# Patient Record
Sex: Male | Born: 1955 | Race: Black or African American | Hispanic: No | Marital: Married | Smoking: Current every day smoker
Health system: Southern US, Community
[De-identification: ages and names within clinical notes are randomized; demographics above are authoritative.]

## PROBLEM LIST (undated history)

## (undated) DIAGNOSIS — T8859XA Other complications of anesthesia, initial encounter: Secondary | ICD-10-CM

## (undated) DIAGNOSIS — K5790 Diverticulosis of intestine, part unspecified, without perforation or abscess without bleeding: Secondary | ICD-10-CM

## (undated) DIAGNOSIS — I10 Essential (primary) hypertension: Secondary | ICD-10-CM

## (undated) DIAGNOSIS — J387 Other diseases of larynx: Secondary | ICD-10-CM

## (undated) DIAGNOSIS — K219 Gastro-esophageal reflux disease without esophagitis: Secondary | ICD-10-CM

## (undated) DIAGNOSIS — G709 Myoneural disorder, unspecified: Secondary | ICD-10-CM

## (undated) DIAGNOSIS — N529 Male erectile dysfunction, unspecified: Secondary | ICD-10-CM

## (undated) DIAGNOSIS — M1990 Unspecified osteoarthritis, unspecified site: Secondary | ICD-10-CM

## (undated) DIAGNOSIS — R7301 Impaired fasting glucose: Secondary | ICD-10-CM

## (undated) DIAGNOSIS — E785 Hyperlipidemia, unspecified: Secondary | ICD-10-CM

## (undated) DIAGNOSIS — M109 Gout, unspecified: Secondary | ICD-10-CM

## (undated) DIAGNOSIS — G4730 Sleep apnea, unspecified: Secondary | ICD-10-CM

## (undated) HISTORY — PX: TONSILLECTOMY: SUR1361

## (undated) HISTORY — DX: Unspecified osteoarthritis, unspecified site: M19.90

## (undated) HISTORY — DX: Male erectile dysfunction, unspecified: N52.9

## (undated) HISTORY — DX: Impaired fasting glucose: R73.01

## (undated) HISTORY — DX: Hyperlipidemia, unspecified: E78.5

## (undated) HISTORY — PX: JOINT REPLACEMENT: SHX530

## (undated) HISTORY — DX: Gout, unspecified: M10.9

## (undated) HISTORY — DX: Essential (primary) hypertension: I10

## (undated) HISTORY — PX: ACHILLES TENDON SURGERY: SHX542

## (undated) HISTORY — DX: Diverticulosis of intestine, part unspecified, without perforation or abscess without bleeding: K57.90

---

## 2003-02-21 HISTORY — PX: TOTAL HIP ARTHROPLASTY: SHX124

## 2008-07-19 ENCOUNTER — Emergency Department (HOSPITAL_COMMUNITY): Admission: EM | Admit: 2008-07-19 | Discharge: 2008-07-19 | Payer: Self-pay | Admitting: Emergency Medicine

## 2012-06-22 ENCOUNTER — Other Ambulatory Visit: Payer: Self-pay | Admitting: Family Medicine

## 2012-08-29 ENCOUNTER — Ambulatory Visit: Payer: Self-pay | Admitting: Family Medicine

## 2012-09-16 ENCOUNTER — Other Ambulatory Visit: Payer: Self-pay | Admitting: *Deleted

## 2012-09-16 ENCOUNTER — Other Ambulatory Visit: Payer: Self-pay

## 2012-09-16 MED ORDER — EZETIMIBE 10 MG PO TABS: 10.0000 mg | ORAL_TABLET | Freq: Every day | ORAL | Status: DC

## 2012-09-16 MED ORDER — OLMESARTAN-AMLODIPINE-HCTZ 40-5-25 MG PO TABS: 1.0000 | ORAL_TABLET | Freq: Every day | ORAL | Status: DC

## 2012-10-11 ENCOUNTER — Other Ambulatory Visit: Payer: Self-pay | Admitting: *Deleted

## 2012-10-11 ENCOUNTER — Other Ambulatory Visit: Payer: Self-pay

## 2012-10-11 DIAGNOSIS — Z125 Encounter for screening for malignant neoplasm of prostate: Secondary | ICD-10-CM

## 2012-10-11 DIAGNOSIS — Z0000 Encounter for general adult medical examination without abnormal findings: Secondary | ICD-10-CM

## 2012-10-11 LAB — COMPLETE METABOLIC PANEL WITH GFR
ALT: 22 U/L (ref 0–53)
AST: 22 U/L (ref 0–37)
Albumin: 4.5 g/dL (ref 3.5–5.2)
Alkaline Phosphatase: 49 U/L (ref 39–117)
BUN: 17 mg/dL (ref 6–23)
CO2: 25 mEq/L (ref 19–32)
Calcium: 10 mg/dL (ref 8.4–10.5)
Chloride: 105 mEq/L (ref 96–112)
Creat: 1.24 mg/dL (ref 0.50–1.35)
GFR, Est African American: 75 mL/min
GFR, Est Non African American: 65 mL/min
Glucose, Bld: 98 mg/dL (ref 70–99)
Potassium: 4 mEq/L (ref 3.5–5.3)
Sodium: 141 mEq/L (ref 135–145)
Total Bilirubin: 0.8 mg/dL (ref 0.3–1.2)
Total Protein: 7.2 g/dL (ref 6.0–8.3)

## 2012-10-11 LAB — CBC WITH DIFFERENTIAL/PLATELET
Basophils Absolute: 0 10*3/uL (ref 0.0–0.1)
Basophils Relative: 0 % (ref 0–1)
Eosinophils Absolute: 0.2 10*3/uL (ref 0.0–0.7)
Eosinophils Relative: 3 % (ref 0–5)
HCT: 42.4 % (ref 39.0–52.0)
Hemoglobin: 14.7 g/dL (ref 13.0–17.0)
Lymphocytes Relative: 41 % (ref 12–46)
Lymphs Abs: 1.9 10*3/uL (ref 0.7–4.0)
MCH: 28.9 pg (ref 26.0–34.0)
MCHC: 34.7 g/dL (ref 30.0–36.0)
MCV: 83.5 fL (ref 78.0–100.0)
Monocytes Absolute: 0.6 10*3/uL (ref 0.1–1.0)
Monocytes Relative: 14 % — ABNORMAL HIGH (ref 3–12)
Neutro Abs: 1.9 10*3/uL (ref 1.7–7.7)
Neutrophils Relative %: 42 % — ABNORMAL LOW (ref 43–77)
Platelets: 239 10*3/uL (ref 150–400)
RBC: 5.08 MIL/uL (ref 4.22–5.81)
RDW: 14.2 % (ref 11.5–15.5)
WBC: 4.6 10*3/uL (ref 4.0–10.5)

## 2012-10-11 LAB — LIPID PANEL
Cholesterol: 218 mg/dL — ABNORMAL HIGH (ref 0–200)
HDL: 31 mg/dL — ABNORMAL LOW (ref >39)
LDL Cholesterol: 129 mg/dL — ABNORMAL HIGH (ref 0–99)
Total CHOL/HDL Ratio: 7 Ratio
Triglycerides: 289 mg/dL — ABNORMAL HIGH (ref <150)
VLDL: 58 mg/dL — ABNORMAL HIGH (ref 0–40)

## 2012-10-11 LAB — PSA, TOTAL AND FREE
PSA, Free Pct: 26 % (ref >25)
PSA, Free: 0.29 ng/mL
PSA: 1.12 ng/mL (ref <=4.00)

## 2012-10-18 ENCOUNTER — Encounter: Payer: Self-pay | Admitting: Family Medicine

## 2012-10-18 ENCOUNTER — Ambulatory Visit (INDEPENDENT_AMBULATORY_CARE_PROVIDER_SITE_OTHER): Admitting: Family Medicine

## 2012-10-18 VITALS — BP 149/84 | HR 71 | Resp 16 | Ht 75.0 in | Wt 257.0 lb

## 2012-10-18 DIAGNOSIS — I10 Essential (primary) hypertension: Secondary | ICD-10-CM

## 2012-10-18 DIAGNOSIS — IMO0001 Reserved for inherently not codable concepts without codable children: Secondary | ICD-10-CM | POA: Insufficient documentation

## 2012-10-18 DIAGNOSIS — M109 Gout, unspecified: Secondary | ICD-10-CM | POA: Insufficient documentation

## 2012-10-18 DIAGNOSIS — N529 Male erectile dysfunction, unspecified: Secondary | ICD-10-CM

## 2012-10-18 DIAGNOSIS — E785 Hyperlipidemia, unspecified: Secondary | ICD-10-CM

## 2012-10-18 DIAGNOSIS — M2550 Pain in unspecified joint: Secondary | ICD-10-CM

## 2012-10-18 DIAGNOSIS — M1990 Unspecified osteoarthritis, unspecified site: Secondary | ICD-10-CM

## 2012-10-18 LAB — SEDIMENTATION RATE: Sed Rate: 7 mm/hr (ref 0–16)

## 2012-10-18 LAB — URIC ACID: Uric Acid, Serum: 8.5 mg/dL — ABNORMAL HIGH (ref 4.0–7.8)

## 2012-10-18 LAB — CK: Total CK: 420 U/L — ABNORMAL HIGH (ref 7–232)

## 2012-10-18 LAB — C-REACTIVE PROTEIN: CRP: <0.5 mg/dL (ref <0.60)

## 2012-10-18 LAB — RHEUMATOID FACTOR: Rhuematoid fact SerPl-aCnc: 11 IU/mL (ref <=14)

## 2012-10-18 MED ORDER — CHOLINE FENOFIBRATE 135 MG PO CPDR: 135.0000 mg | DELAYED_RELEASE_CAPSULE | Freq: Every day | ORAL | Status: DC

## 2012-10-18 MED ORDER — EZETIMIBE 10 MG PO TABS: 10.0000 mg | ORAL_TABLET | Freq: Every day | ORAL | Status: DC

## 2012-10-18 MED ORDER — VARDENAFIL HCL 20 MG PO TABS: 20.0000 mg | ORAL_TABLET | Freq: Every day | ORAL | Status: DC | PRN

## 2012-10-18 MED ORDER — IBUPROFEN 800 MG PO TABS: 800.0000 mg | ORAL_TABLET | Freq: Three times a day (TID) | ORAL | Status: DC | PRN

## 2012-10-18 MED ORDER — OLMESARTAN-AMLODIPINE-HCTZ 40-5-25 MG PO TABS: 1.0000 | ORAL_TABLET | Freq: Every day | ORAL | Status: DC

## 2012-10-18 MED ORDER — OMEGA-3-ACID ETHYL ESTERS 1 G PO CAPS: 2.0000 g | ORAL_CAPSULE | Freq: Two times a day (BID) | ORAL | Status: DC

## 2012-10-18 NOTE — Patient Instructions (Addendum)
1)  Cholesterol - Take Zetia, Lovaza and Triplipix daily.  We'll hold on the statin for now.  2)  BP - Take your Tribenzor daily and decrease your sodium.   2 Gram Low Sodium Diet A 2 gram sodium diet restricts the amount of sodium in the diet to no more than 2 g or 2000 mg daily. Limiting the amount of sodium is often used to help lower blood pressure. It is important if you have heart, liver, or kidney problems. Many foods contain sodium for flavor and sometimes as a preservative. When the amount of sodium in a diet needs to be low, it is important to know what to look for when choosing foods and drinks. The following includes some information and guidelines to help make it easier for you to adapt to a low sodium diet. QUICK TIPS  Do not add salt to food.  Avoid convenience items and fast food.  Choose unsalted snack foods.  Buy lower sodium products, often labeled as "lower sodium" or "no salt added."  Check food labels to learn how much sodium is in 1 serving.  When eating at a restaurant, ask that your food be prepared with less salt or none, if possible. READING FOOD LABELS FOR SODIUM INFORMATION The nutrition facts label is a good place to find how much sodium is in foods. Look for products with no more than 500 to 600 mg of sodium per meal and no more than 150 mg per serving. Remember that 2 g = 2000 mg. The food label may also list foods as:  Sodium-free: Less than 5 mg in a serving.  Very low sodium: 35 mg or less in a serving.  Low-sodium: 140 mg or less in a serving.  Light in sodium: 50% less sodium in a serving. For example, if a food that usually has 300 mg of sodium is changed to become light in sodium, it will have 150 mg of sodium.  Reduced sodium: 25% less sodium in a serving. For example, if a food that usually has 400 mg of sodium is changed to reduced sodium, it will have 300 mg of sodium. CHOOSING FOODS Grains  Avoid: Salted crackers and snack items. Some  cereals, including instant hot cereals. Bread stuffing and biscuit mixes. Seasoned rice or pasta mixes.  Choose: Unsalted snack items. Low-sodium cereals, oats, puffed wheat and rice, shredded wheat. English muffins and bread. Pasta. Meats  Avoid: Salted, canned, smoked, spiced, pickled meats, including fish and poultry. Bacon, ham, sausage, cold cuts, hot dogs, anchovies.  Choose: Low-sodium canned tuna and salmon. Fresh or frozen meat, poultry, and fish. Dairy  Avoid: Processed cheese and spreads. Cottage cheese. Buttermilk and condensed milk. Regular cheese.  Choose: Milk. Low-sodium cottage cheese. Yogurt. Sour cream. Low-sodium cheese. Fruits and Vegetables  Avoid: Regular canned vegetables. Regular canned tomato sauce and paste. Frozen vegetables in sauces. Olives. Rosita Fire. Relishes. Sauerkraut.  Choose: Low-sodium canned vegetables. Low-sodium tomato sauce and paste. Frozen or fresh vegetables. Fresh and frozen fruit. Condiments  Avoid: Canned and packaged gravies. Worcestershire sauce. Tartar sauce. Barbecue sauce. Soy sauce. Steak sauce. Ketchup. Onion, garlic, and table salt. Meat flavorings and tenderizers.  Choose: Fresh and dried herbs and spices. Low-sodium varieties of mustard and ketchup. Lemon juice. Tabasco sauce. Horseradish. SAMPLE 2 GRAM SODIUM MEAL PLAN Breakfast / Sodium (mg)  1 cup low-fat milk / 143 mg  2 slices whole-wheat toast / 270 mg  1 tbs heart-healthy margarine / 153 mg  1 hard-boiled egg /  139 mg  1 small orange / 0 mg Lunch / Sodium (mg)  1 cup raw carrots / 76 mg   cup hummus / 298 mg  1 cup low-fat milk / 143 mg   cup red grapes / 2 mg  1 whole-wheat pita bread / 356 mg Dinner / Sodium (mg)  1 cup whole-wheat pasta / 2 mg  1 cup low-sodium tomato sauce / 73 mg  3 oz lean ground beef / 57 mg  1 small side salad (1 cup raw spinach leaves,  cup cucumber,  cup yellow bell pepper) with 1 tsp olive oil and 1 tsp red wine vinegar  / 25 mg Snack / Sodium (mg)  1 container low-fat vanilla yogurt / 107 mg  3 graham cracker squares / 127 mg Nutrient Analysis  Calories: 2033  Protein: 77 g  Carbohydrate: 282 g  Fat: 72 g  Sodium: 1971 mg Document Released: 02/06/2005 Document Revised: 05/01/2011 Document Reviewed: 05/10/2009 [employer] Patient Information 2014 [place], [place].

## 2012-10-18 NOTE — Progress Notes (Signed)
Subjective:    Patient ID: Terrance Snyder, male    DOB: 16-Nov-1955, 57 y.o.   MRN: [id number]  HPI  [name] is here today to go over his most recent lab results, discuss the conditions listed below and to get refills on his medications:    1)  Hyperlipidemia:  He is supposed to be on a combination of Zetia, Welchol and Lovaza.  He is taking the Zetia daily.  He admits to not be very good at all with taking the [facility].  He would prefer to take pills over the drink.  He is also supposed to be on Lovaza but he thinks that his wife has been taking his supply!!    2)  Hypertension:  He continues doing well withTribenzor (40/5/25) and needs a refill on it.    3)  ED:   He needs a refill on his Levitra.    4)  Generalized Joint Pain:  He continues to struggle with pain in his left hip and shoulders.   He needs a refill on his Ibuprofen 800 mg. He last saw Dr. Thamas Jaegers a year ago.     Review of Systems  Constitutional: Negative.   HENT: Negative.   Eyes: Negative.   Respiratory: Negative.   Cardiovascular: Negative.   Gastrointestinal: Negative.   Endocrine: Negative.   Genitourinary: Negative.   Musculoskeletal: Positive for arthralgias.  Skin: Negative.   Allergic/Immunologic: Negative.   Neurological: Negative.   Hematological: Negative.   Psychiatric/Behavioral: Negative.     Past Medical History  Diagnosis Date  . Hypertension   . Hyperlipidemia   . Diverticulosis   . Osteoarthritis   . Gout   . Impaired fasting glucose   . ED (erectile dysfunction)     Past Surgical History  Procedure Laterality Date  . Joint replacement      THR right  . Achilles tendon surgery      Right    Family History  Problem Relation Age of Onset  . CVA Mother   . Rheum arthritis Mother   . Hyperlipidemia Mother   . Hyperlipidemia Father   . Cancer Father     Colon Cancer    History   Social History Narrative   Marital Status: Married Environmental education officer)    Children:  Daughters (3) Son (1)     Pets:  None    Living Situation: Lives with spouse and children.     Occupation: [employer] ([employer]); He was in the [employer] for 4 years.     Education:  Financial planner)    Tobacco Use/Exposure:  He smokes 1/2 - 1 ppd.    Alcohol Use:  Daily (Beer)    Drug Use:  None   Diet:  Regular   Exercise:  Limited    Hobbies:  Gardening                 Objective:   Physical Exam  Vitals reviewed. Constitutional: He is oriented to person, place, and time. He appears well-developed and well-nourished. No distress.  Eyes: Conjunctivae are normal. No scleral icterus.  Neck: Neck supple. No thyromegaly present.  Cardiovascular: Normal rate, regular rhythm and normal heart sounds.   Pulmonary/Chest: Effort normal and breath sounds normal.  Musculoskeletal: He exhibits no edema and no tenderness.  Neurological: He is alert and oriented to person, place, and time.  Skin: Skin is warm and dry.  Psychiatric: He has a normal mood and affect.      Assessment &  Plan:

## 2012-10-22 LAB — ANA: Anti Nuclear Antibody(ANA): NEGATIVE

## 2012-10-22 LAB — CYCLIC CITRUL PEPTIDE ANTIBODY, IGG: Cyclic Citrullin Peptide Ab: 21.3 U/mL — ABNORMAL HIGH (ref 0.0–5.0)

## 2012-10-25 ENCOUNTER — Telehealth: Payer: Self-pay | Admitting: *Deleted

## 2012-10-25 NOTE — Telephone Encounter (Signed)
Terrance Snyder is aware of his appt with Dr Sharmon Revere on 11/04/12 at 6:00. Pg

## 2012-12-21 NOTE — Assessment & Plan Note (Signed)
Rechecking a Rheumatoid Factor.

## 2012-12-21 NOTE — Assessment & Plan Note (Signed)
He will try a combination of Zetia, Lovaza and Trilipix.  We might consider Livalo in the future depending on his CPK.

## 2012-12-21 NOTE — Assessment & Plan Note (Signed)
Refilled his Levitra.

## 2012-12-21 NOTE — Assessment & Plan Note (Signed)
Refilled his Ibuprofen.

## 2012-12-21 NOTE — Assessment & Plan Note (Signed)
Checking an arthritis panel.

## 2012-12-21 NOTE — Assessment & Plan Note (Signed)
His uric acid was noted to be elevated a couple of years ago (8.9). He was on Uloric due to elevated creatinine.  He is not taking it at this time.

## 2012-12-21 NOTE — Assessment & Plan Note (Addendum)
His BP is too high and he admits to missing his medication at times. He is to work harder on diet and exercise.

## 2012-12-28 ENCOUNTER — Other Ambulatory Visit: Payer: Self-pay | Admitting: Family Medicine

## 2013-01-20 ENCOUNTER — Other Ambulatory Visit

## 2013-01-24 ENCOUNTER — Ambulatory Visit: Admitting: Family Medicine

## 2013-01-29 ENCOUNTER — Other Ambulatory Visit: Payer: Self-pay | Admitting: *Deleted

## 2013-01-29 DIAGNOSIS — E785 Hyperlipidemia, unspecified: Secondary | ICD-10-CM

## 2013-01-29 DIAGNOSIS — M109 Gout, unspecified: Secondary | ICD-10-CM

## 2013-01-30 ENCOUNTER — Other Ambulatory Visit

## 2013-01-30 LAB — COMPLETE METABOLIC PANEL WITH GFR
ALT: 17 U/L (ref 0–53)
AST: 17 U/L (ref 0–37)
Albumin: 4.5 g/dL (ref 3.5–5.2)
Alkaline Phosphatase: 49 U/L (ref 39–117)
BUN: 18 mg/dL (ref 6–23)
CO2: 31 mEq/L (ref 19–32)
Calcium: 10.4 mg/dL (ref 8.4–10.5)
Chloride: 100 mEq/L (ref 96–112)
Creat: 1.19 mg/dL (ref 0.50–1.35)
GFR, Est African American: 78 mL/min
GFR, Est Non African American: 67 mL/min
Glucose, Bld: 101 mg/dL — ABNORMAL HIGH (ref 70–99)
Potassium: 3.8 mEq/L (ref 3.5–5.3)
Sodium: 140 mEq/L (ref 135–145)
Total Bilirubin: 0.6 mg/dL (ref 0.3–1.2)
Total Protein: 7.1 g/dL (ref 6.0–8.3)

## 2013-01-30 LAB — LIPID PANEL
Cholesterol: 222 mg/dL — ABNORMAL HIGH (ref 0–200)
HDL: 36 mg/dL — ABNORMAL LOW (ref >39)
LDL Cholesterol: 149 mg/dL — ABNORMAL HIGH (ref 0–99)
Total CHOL/HDL Ratio: 6.2 Ratio
Triglycerides: 187 mg/dL — ABNORMAL HIGH (ref <150)
VLDL: 37 mg/dL (ref 0–40)

## 2013-01-30 LAB — URIC ACID: Uric Acid, Serum: 8.6 mg/dL — ABNORMAL HIGH (ref 4.0–7.8)

## 2013-02-06 ENCOUNTER — Encounter (INDEPENDENT_AMBULATORY_CARE_PROVIDER_SITE_OTHER): Payer: Self-pay

## 2013-02-06 ENCOUNTER — Ambulatory Visit (INDEPENDENT_AMBULATORY_CARE_PROVIDER_SITE_OTHER): Admitting: Family Medicine

## 2013-02-06 ENCOUNTER — Encounter: Payer: Self-pay | Admitting: Family Medicine

## 2013-02-06 VITALS — BP 153/88 | HR 64 | Resp 16 | Wt 269.0 lb

## 2013-02-06 DIAGNOSIS — E785 Hyperlipidemia, unspecified: Secondary | ICD-10-CM

## 2013-02-06 DIAGNOSIS — K219 Gastro-esophageal reflux disease without esophagitis: Secondary | ICD-10-CM

## 2013-02-06 DIAGNOSIS — M1990 Unspecified osteoarthritis, unspecified site: Secondary | ICD-10-CM

## 2013-02-06 MED ORDER — FENOFIBRATE 120 MG PO TABS: 1.0000 | ORAL_TABLET | Freq: Every day | ORAL | Status: DC

## 2013-02-06 MED ORDER — EZETIMIBE 10 MG PO TABS: 10.0000 mg | ORAL_TABLET | Freq: Every day | ORAL | Status: AC

## 2013-02-06 MED ORDER — OMEPRAZOLE 20 MG PO CPDR: 20.0000 mg | DELAYED_RELEASE_CAPSULE | Freq: Two times a day (BID) | ORAL | Status: DC

## 2013-02-06 MED ORDER — IBUPROFEN 800 MG PO TABS: 800.0000 mg | ORAL_TABLET | Freq: Three times a day (TID) | ORAL | Status: AC | PRN

## 2013-02-06 NOTE — Progress Notes (Signed)
Subjective:    Patient ID: Terrance Snyder, male    DOB: [birth year], 57 y.o.   MRN: [id number]  HPI  Terrance Snyder is here today to go over his most recent lab results and to discuss the conditions listed below:   1)  Left Hip Pain:  He has been having pain in his left hip.  He is scheduled to see Dr Thamas Jaegers on 02/14/13.    2)  Hyperlipidemia:  He is no longer taking Welchol but is taking Lovaza and Zetia without any problem.     3)  GERD:  He has noted some acid reflux and burning in his upper abdomen. This problem is usually worse at night.  He has taken antiacid tablets which have not helped him very much.     Review of Systems  Constitutional: Negative for fatigue and unexpected weight change.  HENT: Negative.   Respiratory: Negative for shortness of breath.   Cardiovascular: Negative for chest pain, palpitations and leg swelling.  Gastrointestinal: Negative.        Acid reflux and burning in his upper abdomen.   Genitourinary: Negative.   Musculoskeletal: Positive for arthralgias. Negative for myalgias.       Left hip   Skin: Negative.   Neurological: Negative.   Psychiatric/Behavioral: Negative.     Past Medical History  Diagnosis Date  . Hypertension   . Hyperlipidemia   . Diverticulosis   . Osteoarthritis   . Gout   . Impaired fasting glucose   . ED (erectile dysfunction)      Past Surgical History  Procedure Laterality Date  . Joint replacement      THR right  . Achilles tendon surgery      Right     History   Social History Narrative   Marital Status: Married Environmental education officer)    Children:  Daughters (3) Son (1)    Pets:  None    Living Situation: Lives with spouse and children.     Occupation: [employer] ([employer]); He was in the [employer] for 4 years.     Education:  Financial planner)    Tobacco Use/Exposure:  He smokes 1/2 - 1 ppd.    Alcohol Use:  Daily (Beer)    Drug Use:  None   Diet:  Regular   Exercise:  Limited    Hobbies:   Gardening               Family History  Problem Relation Age of Onset  . CVA Mother   . Rheum arthritis Mother   . Hyperlipidemia Mother   . Hyperlipidemia Father   . Cancer Father     Colon Cancer     Current Outpatient Prescriptions on File Prior to Visit  Medication Sig Dispense Refill  . Olmesartan-Amlodipine-HCTZ (TRIBENZOR) 40-5-25 MG TABS Take 1 tablet by mouth daily.  90 tablet  3  . omega-3 acid ethyl esters (LOVAZA) 1 G capsule Take 2 capsules (2 g total) by mouth 2 (two) times daily.  360 capsule  1  . vardenafil (LEVITRA) 20 MG tablet Take 1 tablet (20 mg total) by mouth daily as needed for erectile dysfunction.  20 tablet  3   No current facility-administered medications on file prior to visit.     No Known Allergies   Immunization History  Administered Date(s) Administered  . Tdap 07/14/2010      Objective:   Physical Exam  Constitutional: He is oriented to person, place, and time. He  appears well-nourished. No distress.  HENT:  Head: Normocephalic.  Eyes: No scleral icterus.  Neck: Neck supple. No thyromegaly present.  Cardiovascular: Normal rate, regular rhythm and normal heart sounds.   Pulmonary/Chest: Effort normal and breath sounds normal.  Musculoskeletal: Normal range of motion. He exhibits no edema.  Neurological: He is alert and oriented to person, place, and time.  Skin: Skin is warm and dry. No rash noted.  Psychiatric: He has a normal mood and affect. His behavior is normal. Judgment and thought content normal.      Assessment & Plan:    Terrance Snyder was seen today for medication management.  Diagnoses and associated orders for this visit:  GERD (gastroesophageal reflux disease) - omeprazole (PRILOSEC) 20 MG capsule; Take 1 capsule (20 mg total) by mouth 2 (two) times daily before a meal.  Osteoarthritis - ibuprofen (ADVIL,MOTRIN) 800 MG tablet; Take 1 tablet (800 mg total) by mouth every 8 (eight) hours as needed.  Other and  unspecified hyperlipidemia Comments:   - ezetimibe (ZETIA) 10 MG tablet; Take 1 tablet (10 mg total) by mouth daily. - Fenofibrate 120 MG TABS; Take 1 tablet (120 mg total) by mouth daily.   TIME SPENT "FACE TO FACE" WITH PATIENT -  30 MINS

## 2013-02-06 NOTE — Patient Instructions (Signed)
1)  Cholesterol - Take the combination of Zetia and Fenoglide plus 1-2 of the Lovaza daily.  We'll recheck your labs in 6 months.    2)  GERD - Take 1 Dexilant daily for your stomach pain.  You'll replace this with omeprazole 2 times per day.     Diet for Gastroesophageal Reflux Disease, Adult Reflux (acid reflux) is when acid from your stomach flows up into the esophagus. When acid comes in contact with the esophagus, the acid causes irritation and soreness (inflammation) in the esophagus. When reflux happens often or so severely that it causes damage to the esophagus, it is called gastroesophageal reflux disease (GERD). Nutrition therapy can help ease the discomfort of GERD. FOODS OR DRINKS TO AVOID OR LIMIT  Smoking or chewing tobacco. Nicotine is one of the most potent stimulants to acid production in the gastrointestinal tract.  Caffeinated and decaffeinated coffee and black tea.  Regular or low-calorie carbonated beverages or energy drinks (caffeine-free carbonated beverages are allowed).   Strong spices, such as black pepper, white pepper, red pepper, cayenne, curry powder, and chili powder.  Peppermint or spearmint.  Chocolate.  High-fat foods, including meats and fried foods. Extra added fats including oils, butter, salad dressings, and nuts. Limit these to less than 8 tsp per day.  Fruits and vegetables if they are not tolerated, such as citrus fruits or tomatoes.  Alcohol.  Any food that seems to aggravate your condition. If you have questions regarding your diet, call your caregiver or a registered dietitian. OTHER THINGS THAT MAY HELP GERD INCLUDE:   Eating your meals slowly, in a relaxed setting.  Eating 5 to 6 small meals per day instead of 3 large meals.  Eliminating food for a period of time if it causes distress.  Not lying down until 3 hours after eating a meal.  Keeping the head of your bed raised 6 to 9 inches (15 to 23 cm) by using a foam wedge or blocks  under the legs of the bed. Lying flat may make symptoms worse.  Being physically active. Weight loss may be helpful in reducing reflux in overweight or obese adults.  Wear loose fitting clothing EXAMPLE MEAL PLAN This meal plan is approximately 2,000 calories based on https://www.bernard.org/ meal planning guidelines. Breakfast   cup cooked oatmeal.  1 cup strawberries.  1 cup low-fat milk.  1 oz almonds. Snack  1 cup cucumber slices.  6 oz yogurt (made from low-fat or fat-free milk). Lunch  2 slice whole-wheat bread.  2 oz sliced [place].  2 tsp mayonnaise.  1 cup blueberries.  1 cup snap peas. Snack  6 whole-wheat crackers.  1 oz string cheese. Dinner   cup brown rice.  1 cup mixed veggies.  1 tsp olive oil.  3 oz grilled fish. Document Released: 02/06/2005 Document Revised: 05/01/2011 Document Reviewed: 12/23/2010 [facility] Patient Information 2014 [place], [place].

## 2013-02-20 HISTORY — PX: JOINT REPLACEMENT: SHX530

## 2013-04-17 ENCOUNTER — Ambulatory Visit: Admitting: Family Medicine

## 2013-04-29 ENCOUNTER — Encounter: Payer: Self-pay | Admitting: Family Medicine

## 2013-04-29 ENCOUNTER — Ambulatory Visit (INDEPENDENT_AMBULATORY_CARE_PROVIDER_SITE_OTHER): Admitting: Family Medicine

## 2013-04-29 VITALS — BP 137/84 | HR 86 | Resp 16 | Wt 264.0 lb

## 2013-04-29 DIAGNOSIS — Z01818 Encounter for other preprocedural examination: Secondary | ICD-10-CM

## 2013-04-29 DIAGNOSIS — K219 Gastro-esophageal reflux disease without esophagitis: Secondary | ICD-10-CM

## 2013-04-29 DIAGNOSIS — M109 Gout, unspecified: Secondary | ICD-10-CM

## 2013-04-29 NOTE — Patient Instructions (Signed)
1)  Surgical Clearance - Cleared for surgery; to keep from becoming constipated on the pain medication start with a stool softener +/- 1 cap of Miralax in 8 oz of liquid and drink up to 3 x daily.  If you continue to have more problems you can always try 1 Linzess (sample) per day.   2)  GERD - If you don't want to take omeprazole daily try taking ranitidine (Zantac) 300 mg per day plus Tums/Mylanta etc.  3)  Gout - Once your hip is all done, we might consider getting your uric acid level to normal to decrease overall pain.  Uloric/Colcrys.  Total Hip Replacement Care After Refer to this sheet in the next few weeks. These instructions provide you with information on caring for yourself after your procedure. Your caregiver also may give you specific instructions. Your treatment has been planned according to the most current medical practices, but problems sometimes occur. Call your caregiver if you have any problems or questions after your procedure. HOME CARE INSTRUCTIONS  Your caregiver will give you specific precautions for certain types of movement. Additional instructions include:  Take over-the-counter or prescription medicines for pain, discomfort, or fever only as directed by your caregiver.  Take quick showers (3 5 min) rather than bathe until your caregiver tells you that you can take baths again.  Avoid lifting until your caregiver instructs you otherwise.  Use a raised toilet seat and avoid sitting in low chairs as instructed by your caregiver.  Use crutches or a walker as instructed by your caregiver. SEEK MEDICAL CARE IF:  You have difficulty breathing.  Your wound is red, swollen, or has become increasingly painful.  You have pus draining from your wound.  You have a bad smell coming from your wound.  You have persistent bleeding from your wound.  Your wound breaks open after sutures (stitches) or staples have been removed. SEEK IMMEDIATE MEDICAL CARE IF:   You have  a fever.  You have a rash.  You have pain or swelling in your calf or thigh.  You have shortness of breath or chest pain. MAKE SURE YOU:  Understand these instructions.  Will watch your condition.  Will get help right away if you are not doing well or get worse. Document Released: 08/26/2004 Document Revised: 08/08/2011 Document Reviewed: 03/26/2011 [facility] Patient Information 2014 [place].

## 2013-04-29 NOTE — Progress Notes (Signed)
Subjective:    Patient ID: Terrance Snyder, male    DOB: January 08, 1956, 58 y.o.   MRN: [id number]  HPI  Matis is here today for surgical clearance.  He will undergo left hip replacement.  The date of his surgery is still not decided but he thinks it may be the next month.     Review of Systems  Constitutional: Negative for fatigue and unexpected weight change.  HENT: Negative.   Respiratory: Negative for shortness of breath.   Cardiovascular: Negative for chest pain, palpitations and leg swelling.  Gastrointestinal: Negative.   Genitourinary: Negative.   Musculoskeletal: Positive for arthralgias. Negative for myalgias.       Left hip   Skin: Negative.   Neurological: Negative.   Psychiatric/Behavioral: Negative.      Past Medical History  Diagnosis Date  . Hypertension   . Hyperlipidemia   . Diverticulosis   . Osteoarthritis   . Gout   . Impaired fasting glucose   . ED (erectile dysfunction)      Past Surgical History  Procedure Laterality Date  . Achilles tendon surgery      Right  . Total hip arthroplasty Right 2005     History   Social History Narrative   Marital Status: Married Architect)    Children:  Daughters (3) Son (1)    Pets:  None    Living Situation: Lives with spouse and children.     Occupation: [employer] ([employer]); He was in the [employer] for 4 years.     Education:  Barista)    Tobacco Use/Exposure:  He smokes 1/2 - 1 ppd.    Alcohol Use:  Daily (Beer)    Drug Use:  None   Diet:  Regular   Exercise:  Limited    Hobbies:  Gardening               Family History  Problem Relation Age of Onset  . CVA Mother   . Rheum arthritis Mother   . Hyperlipidemia Mother   . Hyperlipidemia Father   . Cancer Father     Colon Cancer     Current Outpatient Prescriptions on File Prior to Visit  Medication Sig Dispense Refill  . ezetimibe (ZETIA) 10 MG tablet Take 1 tablet (10 mg total) by mouth daily.  90 tablet  3    . Fenofibrate 120 MG TABS Take 1 tablet (120 mg total) by mouth daily.  90 each  3  . ibuprofen (ADVIL,MOTRIN) 800 MG tablet Take 1 tablet (800 mg total) by mouth every 8 (eight) hours as needed.  270 tablet  1  . Olmesartan-Amlodipine-HCTZ (TRIBENZOR) 40-5-25 MG TABS Take 1 tablet by mouth daily.  90 tablet  3  . omega-3 acid ethyl esters (LOVAZA) 1 G capsule Take 2 capsules (2 g total) by mouth 2 (two) times daily.  360 capsule  1  . omeprazole (PRILOSEC) 20 MG capsule Take 1 capsule (20 mg total) by mouth 2 (two) times daily before a meal.  180 capsule  3  . vardenafil (LEVITRA) 20 MG tablet Take 1 tablet (20 mg total) by mouth daily as needed for erectile dysfunction.  20 tablet  3   No current facility-administered medications on file prior to visit.     No Known Allergies   Immunization History  Administered Date(s) Administered  . Tdap 07/14/2010       Objective:   Physical Exam  Constitutional: He is oriented to person, place,  and time. He appears well-developed and well-nourished. No distress.  HENT:  Nose: Nose normal.  Mouth/Throat: No oropharyngeal exudate.  Eyes: Conjunctivae are normal. No scleral icterus.  Neck: Normal range of motion. Neck supple. No thyromegaly present.  Cardiovascular: Normal rate, regular rhythm and normal heart sounds.   No murmur heard. Pulmonary/Chest: Effort normal and breath sounds normal.  Abdominal: Soft. Bowel sounds are normal. He exhibits no mass. There is no tenderness. Hernia confirmed negative in the right inguinal area and confirmed negative in the left inguinal area.  Genitourinary: Testes normal and penis normal. Circumcised.  Musculoskeletal: Normal range of motion. He exhibits no edema and no tenderness.  Lymphadenopathy:    He has no cervical adenopathy.       Right: No inguinal adenopathy present.       Left: No inguinal adenopathy present.  Neurological: He is alert and oriented to person, place, and time.  Skin: No  rash noted.  Psychiatric: He has a normal mood and affect. His behavior is normal. Judgment and thought content normal.      Assessment & Plan:    Huxley was seen today for surgical clearance.  Diagnoses and associated orders for this visit:  Preoperative clearance Comments: Sebastiano' BP is under good control.  He should be fine for his hip surgery.   - EKG 12-Lead  GERD (gastroesophageal reflux disease) Comments: He has been on omeprazole in the past.  He does not feel that he needs to take it daily. He will try Zantac as needed.    Gout Comments: He has been on medication in the past.  He is not sure why he stopped it. He does not want to go back on medication until after his surgery.     TIME SPENT "FACE TO FACE" WITH PATIENT -  30 MINS

## 2013-05-02 ENCOUNTER — Encounter: Payer: Self-pay | Admitting: *Deleted

## 2013-07-05 DIAGNOSIS — Z01818 Encounter for other preprocedural examination: Secondary | ICD-10-CM | POA: Insufficient documentation

## 2013-07-05 DIAGNOSIS — K219 Gastro-esophageal reflux disease without esophagitis: Secondary | ICD-10-CM | POA: Insufficient documentation

## 2013-07-15 ENCOUNTER — Telehealth: Payer: Self-pay

## 2013-07-15 NOTE — Telephone Encounter (Signed)
Terrance Snyder called and said that his rehab nurse took his blood pressure and it is 100/50. Since his surgery he now takes Oxcodine and a blood thinner. The number he can be reached at is [phone]

## 2013-07-20 ENCOUNTER — Other Ambulatory Visit: Payer: Self-pay | Admitting: Family Medicine

## 2013-09-05 ENCOUNTER — Other Ambulatory Visit: Payer: Self-pay | Admitting: Family Medicine

## 2013-09-06 ENCOUNTER — Other Ambulatory Visit: Payer: Self-pay | Admitting: Family Medicine

## 2013-09-19 NOTE — Telephone Encounter (Signed)
Terrance Snyder called to say he was running out of these prescriptions and needs refills Olmesartan-Amlodipine HCTZ, Omega 3, Levitra, Needs to come in for follow up-please advise  Call back number is [phone]

## 2013-09-22 ENCOUNTER — Ambulatory Visit: Admitting: Family Medicine

## 2013-09-23 ENCOUNTER — Ambulatory Visit: Admitting: Family Medicine

## 2015-12-22 HISTORY — PX: CERVICAL DISC SURGERY: SHX588

## 2015-12-24 ENCOUNTER — Other Ambulatory Visit: Payer: Self-pay | Admitting: Neurosurgery

## 2015-12-24 DIAGNOSIS — M25512 Pain in left shoulder: Secondary | ICD-10-CM

## 2016-01-06 ENCOUNTER — Ambulatory Visit (INDEPENDENT_AMBULATORY_CARE_PROVIDER_SITE_OTHER): Admitting: Otolaryngology

## 2016-01-06 DIAGNOSIS — R1312 Dysphagia, oropharyngeal phase: Secondary | ICD-10-CM | POA: Diagnosis not present

## 2016-01-10 ENCOUNTER — Encounter (HOSPITAL_BASED_OUTPATIENT_CLINIC_OR_DEPARTMENT_OTHER): Payer: Self-pay | Admitting: *Deleted

## 2016-01-10 ENCOUNTER — Encounter (HOSPITAL_BASED_OUTPATIENT_CLINIC_OR_DEPARTMENT_OTHER)
Admission: RE | Admit: 2016-01-10 | Discharge: 2016-01-10 | Disposition: A | Discharge status: Home / Self Care | Source: Ambulatory Visit | Attending: Otolaryngology | Admitting: Otolaryngology

## 2016-01-10 ENCOUNTER — Other Ambulatory Visit: Payer: Self-pay

## 2016-01-10 DIAGNOSIS — Z029 Encounter for administrative examinations, unspecified: Secondary | ICD-10-CM | POA: Insufficient documentation

## 2016-01-10 NOTE — Progress Notes (Signed)
   01/10/16 1102  OBSTRUCTIVE SLEEP APNEA  Have you ever been diagnosed with sleep apnea through a sleep study? No  Do you snore loudly (loud enough to be heard through closed doors)?  1  Do you often feel tired, fatigued, or sleepy during the daytime (such as falling asleep during driving or talking to someone)? 0  Has anyone observed you stop breathing during your sleep? 1  Do you have, or are you being treated for high blood pressure? 1  BMI more than 35 kg/m2? 0  Age > 62 (1-yes) 1  Male Gender (Yes=1) 1  Obstructive Sleep Apnea Score 5

## 2016-01-12 ENCOUNTER — Other Ambulatory Visit: Payer: Self-pay | Admitting: Otolaryngology

## 2016-01-17 ENCOUNTER — Ambulatory Visit (HOSPITAL_BASED_OUTPATIENT_CLINIC_OR_DEPARTMENT_OTHER): Admitting: Anesthesiology

## 2016-01-17 ENCOUNTER — Encounter (HOSPITAL_BASED_OUTPATIENT_CLINIC_OR_DEPARTMENT_OTHER)
Admission: RE | Disposition: A | Discharge status: Home / Self Care | Payer: Self-pay | Source: Ambulatory Visit | Attending: Otolaryngology

## 2016-01-17 ENCOUNTER — Ambulatory Visit (HOSPITAL_BASED_OUTPATIENT_CLINIC_OR_DEPARTMENT_OTHER)
Admission: RE | Admit: 2016-01-17 | Discharge: 2016-01-17 | Disposition: A | Discharge status: Home / Self Care | Source: Ambulatory Visit | Attending: Otolaryngology | Admitting: Otolaryngology

## 2016-01-17 ENCOUNTER — Encounter (HOSPITAL_BASED_OUTPATIENT_CLINIC_OR_DEPARTMENT_OTHER): Payer: Self-pay

## 2016-01-17 DIAGNOSIS — J449 Chronic obstructive pulmonary disease, unspecified: Secondary | ICD-10-CM | POA: Insufficient documentation

## 2016-01-17 DIAGNOSIS — J387 Other diseases of larynx: Secondary | ICD-10-CM | POA: Diagnosis present

## 2016-01-17 DIAGNOSIS — K219 Gastro-esophageal reflux disease without esophagitis: Secondary | ICD-10-CM | POA: Diagnosis not present

## 2016-01-17 DIAGNOSIS — I2510 Atherosclerotic heart disease of native coronary artery without angina pectoris: Secondary | ICD-10-CM | POA: Insufficient documentation

## 2016-01-17 DIAGNOSIS — E7800 Pure hypercholesterolemia, unspecified: Secondary | ICD-10-CM | POA: Diagnosis not present

## 2016-01-17 DIAGNOSIS — D3705 Neoplasm of uncertain behavior of pharynx: Secondary | ICD-10-CM | POA: Diagnosis not present

## 2016-01-17 DIAGNOSIS — Z79899 Other long term (current) drug therapy: Secondary | ICD-10-CM | POA: Diagnosis not present

## 2016-01-17 DIAGNOSIS — F17210 Nicotine dependence, cigarettes, uncomplicated: Secondary | ICD-10-CM | POA: Insufficient documentation

## 2016-01-17 DIAGNOSIS — I252 Old myocardial infarction: Secondary | ICD-10-CM | POA: Diagnosis not present

## 2016-01-17 DIAGNOSIS — I10 Essential (primary) hypertension: Secondary | ICD-10-CM | POA: Diagnosis not present

## 2016-01-17 HISTORY — DX: Gastro-esophageal reflux disease without esophagitis: K21.9

## 2016-01-17 HISTORY — PX: MASS EXCISION: SHX2000

## 2016-01-17 HISTORY — DX: Other diseases of larynx: J38.7

## 2016-01-17 HISTORY — PX: DIRECT LARYNGOSCOPY: SHX5326

## 2016-01-17 LAB — POCT I-STAT, CHEM 8
BUN: 16 mg/dL (ref 6–20)
CHLORIDE: 104 mmol/L (ref 101–111)
CREATININE: 1.2 mg/dL (ref 0.61–1.24)
Calcium, Ion: 1.3 mmol/L (ref 1.15–1.40)
GLUCOSE: 103 mg/dL — AB (ref 65–99)
HEMATOCRIT: 44 % (ref 39.0–52.0)
Hemoglobin: 15 g/dL (ref 13.0–17.0)
POTASSIUM: 3.6 mmol/L (ref 3.5–5.1)
Sodium: 141 mmol/L (ref 135–145)
TCO2: 26 mmol/L (ref 0–100)

## 2016-01-17 SURGERY — LARYNGOSCOPY, DIRECT
Anesthesia: General | Site: Throat

## 2016-01-17 MED ORDER — OXYCODONE-ACETAMINOPHEN 5-325 MG PO TABS
1.0000 | ORAL_TABLET | Freq: Once | ORAL | Status: AC
  Administered 2016-01-17: 1 via ORAL

## 2016-01-17 MED ORDER — PROPOFOL 10 MG/ML IV BOLUS
INTRAVENOUS | Status: DC | PRN
  Administered 2016-01-17: 50 mg via INTRAVENOUS
  Administered 2016-01-17: 250 mg via INTRAVENOUS
  Administered 2016-01-17: 50 mg via INTRAVENOUS

## 2016-01-17 MED ORDER — SUCCINYLCHOLINE CHLORIDE 20 MG/ML IJ SOLN
INTRAMUSCULAR | Status: DC | PRN
  Administered 2016-01-17: 120 mg via INTRAVENOUS

## 2016-01-17 MED ORDER — ONDANSETRON HCL 4 MG/2ML IJ SOLN
INTRAMUSCULAR | Status: DC | PRN
Start: 2016-01-17 — End: 2016-01-17
  Administered 2016-01-17: 4 mg via INTRAVENOUS

## 2016-01-17 MED ORDER — LIDOCAINE 2% (20 MG/ML) 5 ML SYRINGE
INTRAMUSCULAR | Status: AC
  Filled 2016-01-17: qty 5

## 2016-01-17 MED ORDER — OXYCODONE-ACETAMINOPHEN 5-325 MG PO TABS
ORAL_TABLET | ORAL | Status: AC
  Filled 2016-01-17: qty 1

## 2016-01-17 MED ORDER — FENTANYL CITRATE (PF) 100 MCG/2ML IJ SOLN
25.0000 ug | INTRAMUSCULAR | Status: DC | PRN
  Administered 2016-01-17 (×2): 50 ug via INTRAVENOUS
  Administered 2016-01-17 (×2): 25 ug via INTRAVENOUS

## 2016-01-17 MED ORDER — LACTATED RINGERS IV SOLN
INTRAVENOUS | Status: DC
  Administered 2016-01-17 (×2): via INTRAVENOUS

## 2016-01-17 MED ORDER — AMOXICILLIN 875 MG PO TABS: 875.0000 mg | ORAL_TABLET | Freq: Two times a day (BID) | ORAL | 0 refills | Status: AC

## 2016-01-17 MED ORDER — FENTANYL CITRATE (PF) 100 MCG/2ML IJ SOLN
INTRAMUSCULAR | Status: AC
  Filled 2016-01-17: qty 2

## 2016-01-17 MED ORDER — LIDOCAINE HCL (CARDIAC) 20 MG/ML IV SOLN
INTRAVENOUS | Status: DC | PRN
  Administered 2016-01-17: 50 mg via INTRAVENOUS

## 2016-01-17 MED ORDER — DEXAMETHASONE SODIUM PHOSPHATE 10 MG/ML IJ SOLN
INTRAMUSCULAR | Status: AC
  Filled 2016-01-17: qty 1

## 2016-01-17 MED ORDER — MIDAZOLAM HCL 2 MG/2ML IJ SOLN
INTRAMUSCULAR | Status: AC
  Filled 2016-01-17: qty 2

## 2016-01-17 MED ORDER — OXYCODONE HCL 5 MG/5ML PO SOLN: 5.0000 mg | Freq: Once | ORAL | Status: DC

## 2016-01-17 MED ORDER — DEXAMETHASONE SODIUM PHOSPHATE 4 MG/ML IJ SOLN
INTRAMUSCULAR | Status: DC | PRN
  Administered 2016-01-17 (×2): 10 mg via INTRAVENOUS

## 2016-01-17 MED ORDER — MIDAZOLAM HCL 2 MG/2ML IJ SOLN
1.0000 mg | INTRAMUSCULAR | Status: DC | PRN
  Administered 2016-01-17: 2 mg via INTRAVENOUS

## 2016-01-17 MED ORDER — ONDANSETRON HCL 4 MG/2ML IJ SOLN
INTRAMUSCULAR | Status: AC
  Filled 2016-01-17: qty 2

## 2016-01-17 MED ORDER — EPINEPHRINE 1 MG/ML IJ SOLN
INTRAMUSCULAR | Status: DC | PRN
  Administered 2016-01-17: 2 mL via TOPICAL

## 2016-01-17 MED ORDER — OXYCODONE-ACETAMINOPHEN 5-325 MG PO TABS: 1.0000 | ORAL_TABLET | ORAL | 0 refills | Status: DC | PRN

## 2016-01-17 MED ORDER — FENTANYL CITRATE (PF) 100 MCG/2ML IJ SOLN
50.0000 ug | INTRAMUSCULAR | Status: DC | PRN
  Administered 2016-01-17: 100 ug via INTRAVENOUS

## 2016-01-17 MED ORDER — SCOPOLAMINE 1 MG/3DAYS TD PT72: 1.0000 | MEDICATED_PATCH | Freq: Once | TRANSDERMAL | Status: DC | PRN

## 2016-01-17 MED ORDER — SUCCINYLCHOLINE CHLORIDE 200 MG/10ML IV SOSY
PREFILLED_SYRINGE | INTRAVENOUS | Status: AC
  Filled 2016-01-17: qty 10

## 2016-01-17 SURGICAL SUPPLY — 72 items
ADAPTER TUBE FLEX ULTRASET (MISCELLANEOUS) ×3 IMPLANT
ATTRACTOMAT 16X20 MAGNETIC DRP (DRAPES) IMPLANT
BLADE SURG 15 STRL LF DISP TIS (BLADE) IMPLANT
BLADE SURG 15 STRL SS (BLADE)
CANISTER SUCT 1200ML W/VALVE (MISCELLANEOUS) ×3 IMPLANT
CONT SPEC 4OZ CLIKSEAL STRL BL (MISCELLANEOUS) ×3 IMPLANT
CORDS BIPOLAR (ELECTRODE) IMPLANT
COVER BACK TABLE 60X90IN (DRAPES) IMPLANT
COVER MAYO STAND STRL (DRAPES) ×3 IMPLANT
DECANTER SPIKE VIAL GLASS SM (MISCELLANEOUS) IMPLANT
DERMABOND ADVANCED (GAUZE/BANDAGES/DRESSINGS)
DERMABOND ADVANCED .7 DNX12 (GAUZE/BANDAGES/DRESSINGS) IMPLANT
DRAIN JACKSON RD 7FR 3/32 (WOUND CARE) IMPLANT
DRAIN PENROSE 1/4X12 LTX STRL (WOUND CARE) IMPLANT
DRAIN TLS ROUND 10FR (DRAIN) IMPLANT
DRAPE U-SHAPE 76X120 STRL (DRAPES) IMPLANT
ELECT COATED BLADE 2.86 ST (ELECTRODE) IMPLANT
ELECT NEEDLE BLADE 2-5/6 (NEEDLE) IMPLANT
ELECT PAIRED SUBDERMAL (MISCELLANEOUS)
ELECT REM PT RETURN 9FT ADLT (ELECTROSURGICAL) ×3
ELECTRODE PAIRED SUBDERMAL (MISCELLANEOUS) IMPLANT
ELECTRODE REM PT RTRN 9FT ADLT (ELECTROSURGICAL) ×1 IMPLANT
EVACUATOR SILICONE 100CC (DRAIN) IMPLANT
FORCEPS BIPOLAR SPETZLER 8 1.0 (NEUROSURGERY SUPPLIES) IMPLANT
GAUZE SPONGE 4X4 16PLY XRAY LF (GAUZE/BANDAGES/DRESSINGS) IMPLANT
GLOVE BIO SURGEON STRL SZ 6.5 (GLOVE) ×2 IMPLANT
GLOVE BIO SURGEON STRL SZ7.5 (GLOVE) ×3 IMPLANT
GLOVE BIO SURGEONS STRL SZ 6.5 (GLOVE) ×1
GOWN STRL REUS W/ TWL LRG LVL3 (GOWN DISPOSABLE) ×2 IMPLANT
GOWN STRL REUS W/TWL LRG LVL3 (GOWN DISPOSABLE) ×4
GUARD TEETH (MISCELLANEOUS) ×3 IMPLANT
HEMOSTAT SURGICEL 2X14 (HEMOSTASIS) IMPLANT
LOCATOR NERVE 3 VOLT (DISPOSABLE) IMPLANT
MARKER SKIN DUAL TIP RULER LAB (MISCELLANEOUS) IMPLANT
NEEDLE HYPO 18GX1.5 BLUNT FILL (NEEDLE) ×3 IMPLANT
NEEDLE HYPO 25X1 1.5 SAFETY (NEEDLE) ×3 IMPLANT
NEEDLE SPNL 22GX7 QUINCKE BK (NEEDLE) IMPLANT
NEEDLE SPNL 25GX3.5 QUINCKE BL (NEEDLE) IMPLANT
NS IRRIG 1000ML POUR BTL (IV SOLUTION) ×3 IMPLANT
PACK BASIN DAY SURGERY FS (CUSTOM PROCEDURE TRAY) ×3 IMPLANT
PATTIES SURGICAL .5 X3 (DISPOSABLE) ×3 IMPLANT
PENCIL BUTTON HOLSTER BLD 10FT (ELECTRODE) ×3 IMPLANT
PIN SAFETY STERILE (MISCELLANEOUS) IMPLANT
PROBE NERVBE PRASS .33 (MISCELLANEOUS) IMPLANT
SHEARS HARMONIC 9CM CVD (BLADE) IMPLANT
SHEET MEDIUM DRAPE 40X70 STRL (DRAPES) ×3 IMPLANT
SLEEVE SCD COMPRESS KNEE MED (MISCELLANEOUS) IMPLANT
SOLUTION BUTLER CLEAR DIP (MISCELLANEOUS) ×3 IMPLANT
SPONGE GAUZE 2X2 8PLY STER LF (GAUZE/BANDAGES/DRESSINGS)
SPONGE GAUZE 2X2 8PLY STRL LF (GAUZE/BANDAGES/DRESSINGS) IMPLANT
SPONGE GAUZE 4X4 12PLY STER LF (GAUZE/BANDAGES/DRESSINGS) ×6 IMPLANT
SUCTION FRAZIER HANDLE 10FR (MISCELLANEOUS)
SUCTION TUBE FRAZIER 10FR DISP (MISCELLANEOUS) IMPLANT
SURGILUBE 2OZ TUBE FLIPTOP (MISCELLANEOUS) IMPLANT
SUT ETHILON 3 0 PS 1 (SUTURE) IMPLANT
SUT ETHILON 5 0 P 3 18 (SUTURE)
SUT NYLON ETHILON 5-0 P-3 1X18 (SUTURE) IMPLANT
SUT PROLENE 4 0 P 3 18 (SUTURE) IMPLANT
SUT SILK 3 0 TIES 17X18 (SUTURE)
SUT SILK 3-0 18XBRD TIE BLK (SUTURE) IMPLANT
SUT SILK 4 0 TIES 17X18 (SUTURE) IMPLANT
SUT VIC AB 3-0 FS2 27 (SUTURE) IMPLANT
SUT VIC AB 4-0 P-3 18XBRD (SUTURE) IMPLANT
SUT VIC AB 4-0 P3 18 (SUTURE)
SUT VICRYL 4-0 PS2 18IN ABS (SUTURE) IMPLANT
SYR BULB 3OZ (MISCELLANEOUS) IMPLANT
SYR CONTROL 10ML LL (SYRINGE) ×3 IMPLANT
SYR TB 1ML LL NO SAFETY (SYRINGE) IMPLANT
TOWEL OR 17X24 6PK STRL BLUE (TOWEL DISPOSABLE) ×3 IMPLANT
TUBE CONNECTING 20'X1/4 (TUBING) ×1
TUBE CONNECTING 20X1/4 (TUBING) ×2 IMPLANT
YANKAUER SUCT BULB TIP NO VENT (SUCTIONS) IMPLANT

## 2016-01-17 NOTE — Anesthesia Procedure Notes (Signed)
Procedure Name: Intubation Date/Time: 01/17/2016 9:25 AM Performed by: Lieutenant Diego Pre-anesthesia Checklist: Patient identified, Emergency Drugs available, Suction available and Patient being monitored Patient Re-evaluated:Patient Re-evaluated prior to inductionOxygen Delivery Method: Circle system utilized Preoxygenation: Pre-oxygenation with 100% oxygen Intubation Type: IV induction Ventilation: Mask ventilation without difficulty Laryngoscope Size: Glidescope Grade View: Grade I Tube type: Oral Tube size: 8.0 mm Number of attempts: 1 Airway Equipment and Method: Stylet and Oral airway Placement Confirmation: ETT inserted through vocal cords under direct vision,  positive ETCO2 and breath sounds checked- equal and bilateral Secured at: 24 cm Tube secured with: Tape Dental Injury: Teeth and Oropharynx as per pre-operative assessment  Comments: Elective glide scope due to recent cervical neck surgery and limited head and neck ROM.

## 2016-01-17 NOTE — Anesthesia Preprocedure Evaluation (Addendum)
Anesthesia Evaluation  Patient identified by MRN, date of birth, ID band Patient awake    Reviewed: Allergy & Precautions, H&P , NPO status , Patient's Chart, lab work & pertinent test results  Airway Mallampati: I  TM Distance: >3 FB Neck ROM: Full    Dental no notable dental hx. (+) Teeth Intact, Dental Advisory Given   Pulmonary Current Smoker,    Pulmonary exam normal breath sounds clear to auscultation       Cardiovascular hypertension, Pt. on medications  Rhythm:Regular Rate:Normal     Neuro/Psych negative neurological ROS  negative psych ROS   GI/Hepatic Neg liver ROS, GERD  Medicated,  Endo/Other  negative endocrine ROS  Renal/GU negative Renal ROS  negative genitourinary   Musculoskeletal  (+) Arthritis ,   Abdominal   Peds  Hematology negative hematology ROS (+)   Anesthesia Other Findings   Reproductive/Obstetrics negative OB ROS                            Anesthesia Physical Anesthesia Plan  ASA: II  Anesthesia Plan: General   Post-op Pain Management:    Induction: Intravenous  Airway Management Planned: Oral ETT  Additional Equipment:   Intra-op Plan:   Post-operative Plan: Extubation in OR  Informed Consent: I have reviewed the patients History and Physical, chart, labs and discussed the procedure including the risks, benefits and alternatives for the proposed anesthesia with the patient or authorized representative who has indicated his/her understanding and acceptance.   Dental advisory given  Plan Discussed with: CRNA  Anesthesia Plan Comments:         Anesthesia Quick Evaluation

## 2016-01-17 NOTE — Transfer of Care (Signed)
Immediate Anesthesia Transfer of Care Note  Patient: Terrance Snyder  Procedure(s) Performed: Procedure(s): DIRECT LARYNGOSCOPY (N/A) EXCISION MASS (N/A)  Patient Location: PACU  Anesthesia Type:General  Level of Consciousness: awake and alert   Airway & Oxygen Therapy: Patient Spontanous Breathing and Patient connected to face mask oxygen  Post-op Assessment: Report given to RN and Post -op Vital signs reviewed and stable  Post vital signs: Reviewed and stable  Last Vitals:  Vitals:   01/17/16 0820  BP: (!) 154/78  Pulse: 79  Resp: 20  Temp: 36.8 C    Last Pain:  Vitals:   01/17/16 0821  TempSrc:   PainSc: 6       Patients Stated Pain Goal: 3 ([id number])  Complications: No apparent anesthesia complications

## 2016-01-17 NOTE — Op Note (Signed)
DATE OF PROCEDURE:  01/17/2016                              OPERATIVE REPORT  SURGEON:  Leta Baptist, MD  PREOPERATIVE DIAGNOSES: 1. Vallecular cyst  POSTOPERATIVE DIAGNOSES: 1. Vallecular cyst  PROCEDURE PERFORMED: Direct laryngoscopy and excision of vallecular cyst  ANESTHESIA:  General endotracheal tube anesthesia.  COMPLICATIONS:  None.  ESTIMATED BLOOD LOSS:  Minimal.  INDICATION FOR PROCEDURE:  Terrance Snyder is a 60 y.o. male who was recently noted to have a 2cm vallecular cyst during an OR intubation.  The patient was minimally symptomatic. However, due to the large size of the cyst, the decision was made for the patient to undergo the above stated procedure. The risks, benefits, alternatives, and details of the procedure were discussed with the patient.  Questions were invited and answered.  Informed consent was obtained.  DESCRIPTION:  The patient was taken to the operating room and placed supine on the operating table.  General endotracheal tube anesthesia was administered by the anesthesiologist.  The patient was positioned and prepped and draped in a standard fashion for direct laryngoscopy.   A DEDO laryngoscope was inserted via the oral cavity into the pharynx. A 2cm vallecular cyst was noted on the left side of the vallecular. The cyst was opened and removed in a piecemeal fashion with a large cup forceps. The rest of the pharynx and larynx were normal.  The care of the patient was turned over to the anesthesiologist.  The patient was awakened from anesthesia without difficulty.  The patient was extubated and transferred to the recovery room in good condition.  OPERATIVE FINDINGS:  A 2cm left vallecular cyst.  SPECIMEN:  Fragments of left vallecular cyst.  FOLLOWUP CARE:  The patient will be discharged home once awake and alert.  He will be placed on amoxicillin 875 mg p.o. b.i.d. for 5 days, and percocet for postop pain control.  The patient will follow up in my office  in approximately 1 week.  Khira Cudmore,SUI W 01/17/2016 10:01 AM

## 2016-01-17 NOTE — H&P (Signed)
Cc: Vallecular cyst  HPI: The patient is a 60 y/o male who presents today for evaluation of a vallecular cyst. The patient is seen in consultation requested by Dr. Kary Kos. The patient was recently noted to have a large vallecular cyst while undergoing intubation for back surgery. The surgery was canceled secondary to concern for airway obstruction. The patient is minimally symptomatic. He has noted occasional globus sensation and vocal change but he related this to his neck surgery in August. The patient has been smoking for 40+ years. No previous ENT surgery is noted.  The patient's review of systems (constitutional, eyes, ENT, cardiovascular, respiratory, GI, musculoskeletal, skin, neurologic, psychiatric, endocrine, hematologic, allergic) is noted in the ROS questionnaire.  It is reviewed with the patient.   Family health history: None.   Major events: Heart attack .   Ongoing medical problems: CAD, COPD, Anxiety, Hypercholesterolemia..   Social history: The patient is married. He smokes one half pack of cigarettes a day. He drinks alcohol once a week. He denies the use of illegal drugs.  Exam General: Communicates without difficulty, well nourished, no acute distress. Head: Normocephalic, no evidence injury, no tenderness, facial buttresses intact without stepoff. Eyes: PERRL, EOMI.  No scleral icterus, conjunctivae clear. Ears: External auditory canals clear bilaterally.  There is no edema or erythema.  Tympanic membrane is within normal limits bilaterally. Nose: Normal skin and external support.  Anterior rhinoscopy reveals healthy pink mucosa over the septum and turbinates.  No lesions or polyps were seen. Oral cavity: Lips without lesions, oral mucosa moist, no masses or lesions seen. Indirect  mirror laryngoscopy could not be tolerated. Pharynx: Clear, no erythema. Neck: Supple, full range of motion, no lymphadenopathy, no masses palpable. Salivary: Parotid and submandibular glands  without mass. Neuro:  CN 2-12 grossly intact. Gait normal. Vestibular: No nystagmus at any point of gaze.   Procedure:  Flexible Fiberoptic Laryngoscopy -- Risks, benefits, and alternatives of flexible endoscopy were explained to the patient.  Specific mention was made of the risk of throat numbness with difficulty swallowing, possible bleeding from the nose and mouth, and pain from the procedure.  The patient gave oral consent to proceed.  The nasal cavities were decongested and anesthetised with a combination of oxymetazoline and 4% lidocaine solution.  The flexible scope was inserted into the right nasal cavity and advanced towards the nasopharynx.  Visualized mucosa over the turbinates and septum were as described above.  The nasopharynx was clear.  Oropharyngeal walls were symmetric and mobile without lesion, mass, or edema.  Hypopharynx was also without lesion or edema.  Larynx was mobile without lesions. A 2 cm vallecular cyst was noted.  True vocal folds were white without mass or lesion.  Base of tongue was within normal limits.  The patient tolerated the procedure well.   Assessment 1.  A 2 cm vallecular cyst is noted on today's laryngoscopy exam, covering over 3/4 of the anterior epiglottic surface. No other suspicious mass or lesion is noted.  Plan  1.  Laryngoscopy findings are reviewed with the patient.  2.  Recommend direct laryngoscopy with excision. The risks, benefits, alternatives, and details of the procedure are reviewed with the patient. Questions are invited and answered. 3.  The patient is interested in proceeding with the procedure.  We will schedule the procedure in accordance with the patient's schedule.

## 2016-01-17 NOTE — Discharge Instructions (Addendum)
The patient may resume all his previous activities and diet.  Avoid hard, sharp, crunchy foods for a few days.  Call your surgeon if you experience:   1.  Fever over 101.0. 2.  Inability to urinate. 3.  Nausea and/or vomiting. 4.  Extreme swelling or bruising at the surgical site. 5.  Continued bleeding from the incision. 6.  Increased pain, redness or drainage from the incision. 7.  Problems related to your pain medication. 8.  Any problems and/or concerns   Post Anesthesia Home Care Instructions  Activity: Get plenty of rest for the remainder of the day. A responsible adult should stay with you for 24 hours following the procedure.  For the next 24 hours, DO NOT: -Drive a car -Paediatric nurse -Drink alcoholic beverages -Take any medication unless instructed by your physician -Make any legal decisions or sign important papers.  Meals: Start with liquid foods such as gelatin or soup. Progress to regular foods as tolerated. Avoid greasy, spicy, heavy foods. If nausea and/or vomiting occur, drink only clear liquids until the nausea and/or vomiting subsides. Call your physician if vomiting continues.  Special Instructions/Symptoms: Your throat may feel dry or sore from the anesthesia or the breathing tube placed in your throat during surgery. If this causes discomfort, gargle with warm salt water. The discomfort should disappear within 24 hours.  If you had a scopolamine patch placed behind your ear for the management of post- operative nausea and/or vomiting:  1. The medication in the patch is effective for 72 hours, after which it should be removed.  Wrap patch in a tissue and discard in the trash. Wash hands thoroughly with soap and water. 2. You may remove the patch earlier than 72 hours if you experience unpleasant side effects which may include dry mouth, dizziness or visual disturbances. 3. Avoid touching the patch. Wash your hands with soap and water after contact with the  patch.

## 2016-01-17 NOTE — Anesthesia Postprocedure Evaluation (Signed)
Anesthesia Post Note  Patient: Civil engineer, contracting  Procedure(s) Performed: Procedure(s) (LRB): DIRECT LARYNGOSCOPY (N/A) EXCISION MASS (N/A)  Patient location during evaluation: PACU Anesthesia Type: General Level of consciousness: awake and alert Pain management: pain level controlled Vital Signs Assessment: post-procedure vital signs reviewed and stable Respiratory status: spontaneous breathing, nonlabored ventilation and respiratory function stable Cardiovascular status: blood pressure returned to baseline and stable Postop Assessment: no signs of nausea or vomiting Anesthetic complications: no    Last Vitals:  Vitals:   01/17/16 1100 01/17/16 1115  BP: (!) 168/93 (!) 176/98  Pulse: 71 64  Resp: (!) 21 18  Temp:      Last Pain:  Vitals:   01/17/16 1115  TempSrc:   PainSc: 4                  Lashara Urey,W. EDMOND

## 2016-01-18 ENCOUNTER — Encounter (HOSPITAL_BASED_OUTPATIENT_CLINIC_OR_DEPARTMENT_OTHER): Payer: Self-pay | Admitting: Otolaryngology

## 2016-01-24 ENCOUNTER — Ambulatory Visit (INDEPENDENT_AMBULATORY_CARE_PROVIDER_SITE_OTHER): Admitting: Otolaryngology

## 2016-01-24 DIAGNOSIS — D109 Benign neoplasm of pharynx, unspecified: Secondary | ICD-10-CM | POA: Diagnosis not present

## 2016-01-24 DIAGNOSIS — R070 Pain in throat: Secondary | ICD-10-CM

## 2016-02-01 ENCOUNTER — Ambulatory Visit
Admission: RE | Admit: 2016-02-01 | Discharge: 2016-02-01 | Disposition: A | Discharge status: Home / Self Care | Source: Ambulatory Visit | Attending: Neurosurgery | Admitting: Neurosurgery

## 2016-02-01 DIAGNOSIS — M25512 Pain in left shoulder: Secondary | ICD-10-CM

## 2016-05-18 ENCOUNTER — Other Ambulatory Visit: Payer: Self-pay | Admitting: Neurosurgery

## 2016-05-18 DIAGNOSIS — M5023 Other cervical disc displacement, cervicothoracic region: Secondary | ICD-10-CM

## 2016-06-03 ENCOUNTER — Ambulatory Visit
Admission: RE | Admit: 2016-06-03 | Discharge: 2016-06-03 | Disposition: A | Discharge status: Home / Self Care | Source: Ambulatory Visit | Attending: Neurosurgery | Admitting: Neurosurgery

## 2016-06-03 DIAGNOSIS — M5023 Other cervical disc displacement, cervicothoracic region: Secondary | ICD-10-CM

## 2016-07-27 ENCOUNTER — Ambulatory Visit (INDEPENDENT_AMBULATORY_CARE_PROVIDER_SITE_OTHER): Admitting: Otolaryngology

## 2016-07-27 DIAGNOSIS — D109 Benign neoplasm of pharynx, unspecified: Secondary | ICD-10-CM | POA: Diagnosis not present

## 2016-08-31 ENCOUNTER — Other Ambulatory Visit: Payer: Self-pay | Admitting: Neurosurgery

## 2016-08-31 DIAGNOSIS — M542 Cervicalgia: Secondary | ICD-10-CM

## 2016-09-04 ENCOUNTER — Ambulatory Visit
Admission: RE | Admit: 2016-09-04 | Discharge: 2016-09-04 | Disposition: A | Discharge status: Home / Self Care | Source: Ambulatory Visit | Attending: Neurosurgery | Admitting: Neurosurgery

## 2016-09-04 DIAGNOSIS — M542 Cervicalgia: Secondary | ICD-10-CM

## 2017-07-26 ENCOUNTER — Ambulatory Visit (INDEPENDENT_AMBULATORY_CARE_PROVIDER_SITE_OTHER): Admitting: Otolaryngology

## 2017-08-27 IMAGING — MR MR SHOULDER*L* W/O CM
4 of 5 series · 30 of 40 positions shown · non-contrast
Comparison: None.

CLINICAL DATA: Left shoulder pain and left arm pain since June 2015.
Weakness and limited range of motion.

EXAM:
MRI OF THE LEFT SHOULDER WITHOUT CONTRAST
TECHNIQUE: Multiplanar, multisequence MR imaging of the shoulder was performed.
No intravenous contrast was administered.

[Series 3: T2 fat-sat · axial · 4.0mm · 0.55mm/px · z∈[-75,-1]mm · 8 of 17 slices shown (1 of 3)]
[im 1/17]
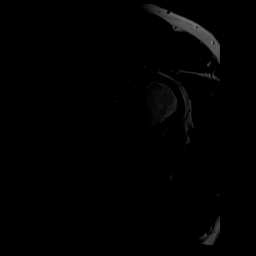
[im 3/17]
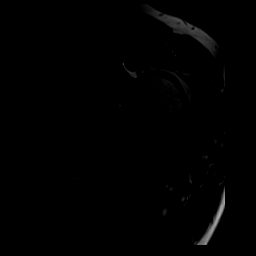
[im 5/17]
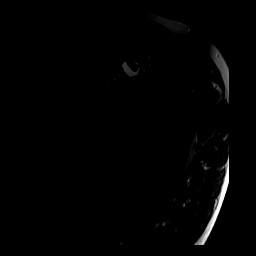
[im 7/17]
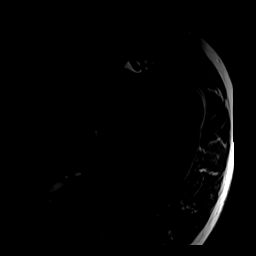
[im 10/17]
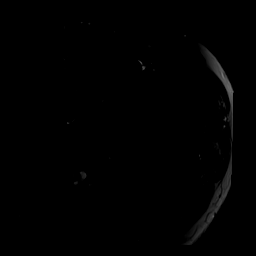
[im 12/17]
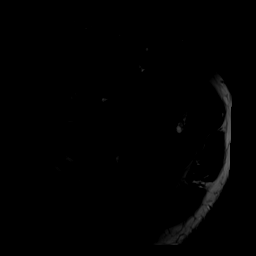
[im 14/17]
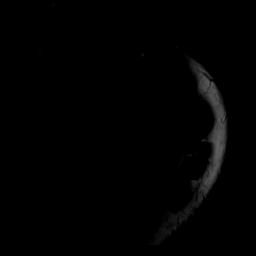
[im 17/17]
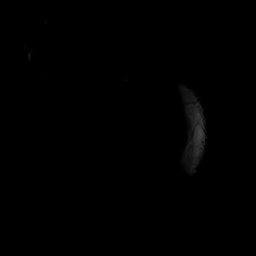

[Series 5: T2 fat-sat · oblique · 4.0mm · 0.59mm/px · 8 of 18 slices shown (2 of 3)]
[im 1/18]
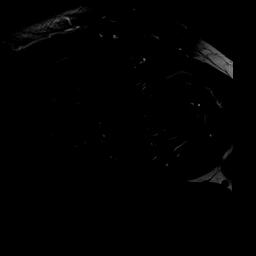
[im 3/18]
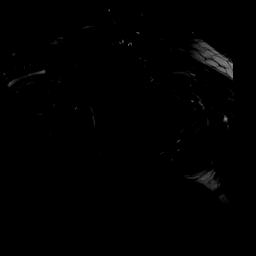
[im 5/18]
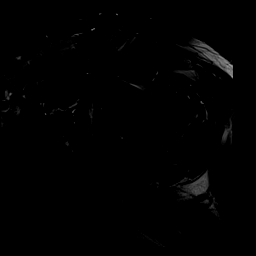
[im 8/18]
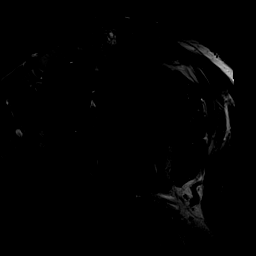
[im 10/18]
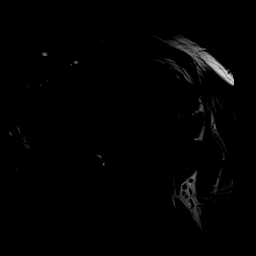
[im 13/18]
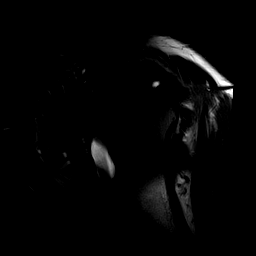
[im 15/18]
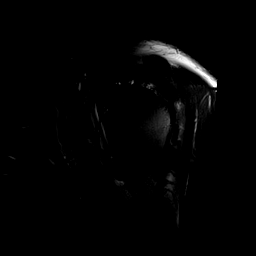
[im 18/18]
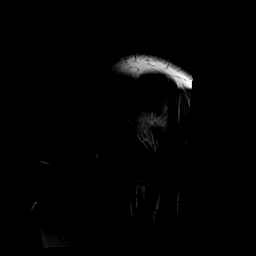

[Series 7: PD · oblique · 4.0mm · 0.31mm/px · 8 of 19 slices shown]
[im 1/19]
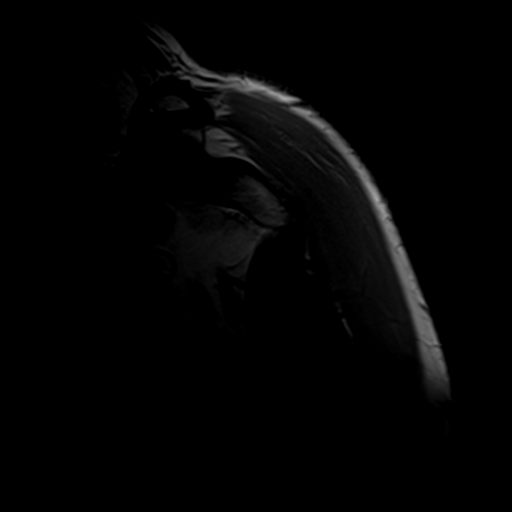
[im 3/19]
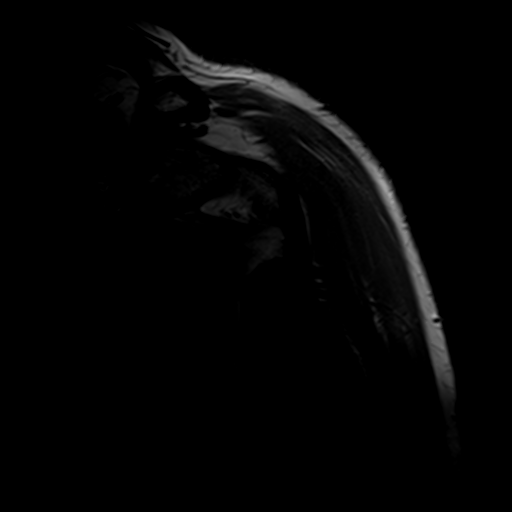
[im 6/19]
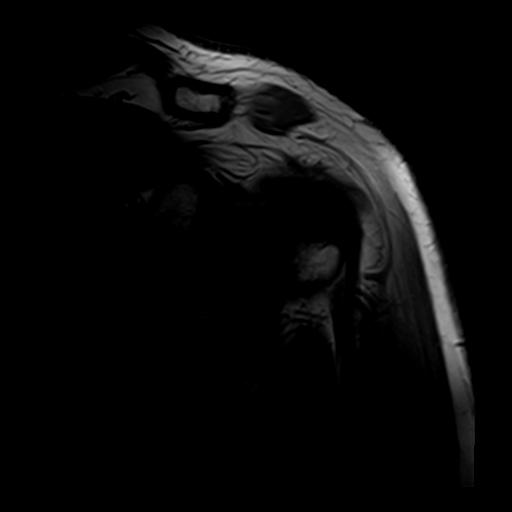
[im 8/19]
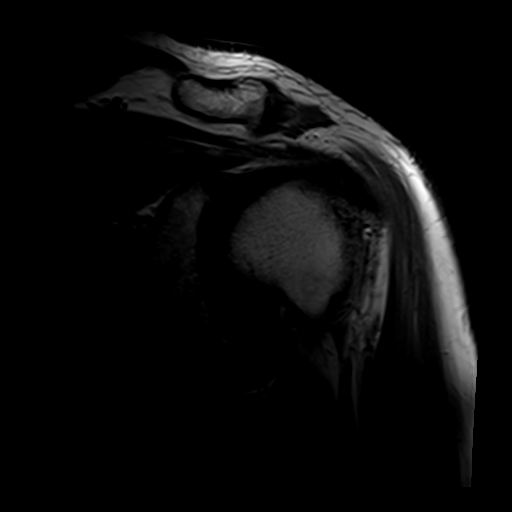
[im 11/19]
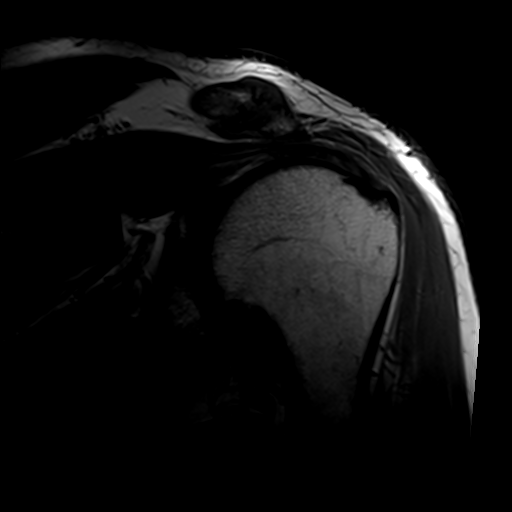
[im 13/19]
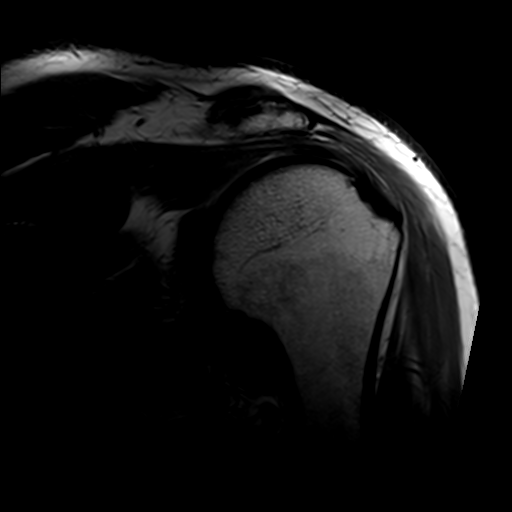
[im 16/19]
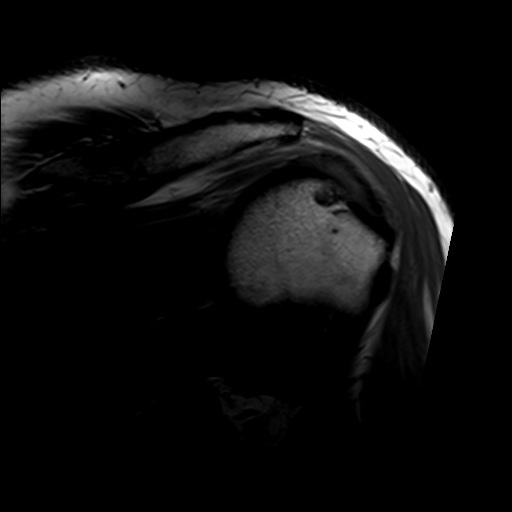
[im 19/19]
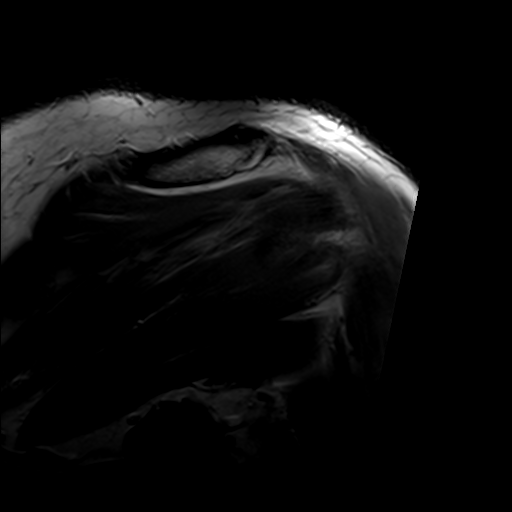

[Series 8: T2 fat-sat · oblique · 4.0mm · 0.31mm/px · 6 of 19 slices shown (3 of 3)]
[im 1/19]
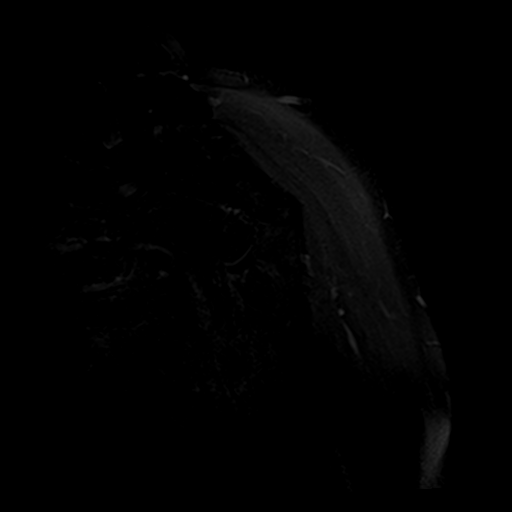
[im 3/19]
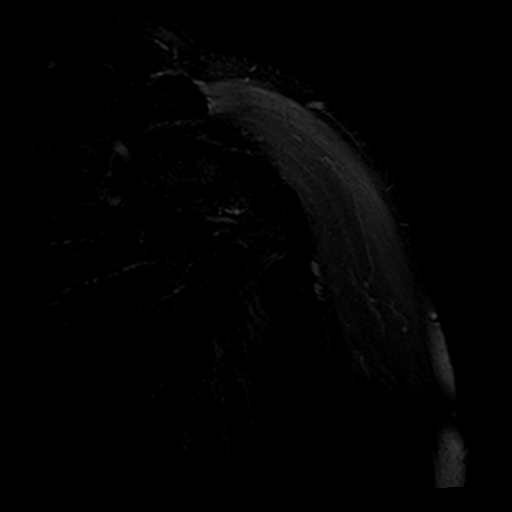
[im 6/19]
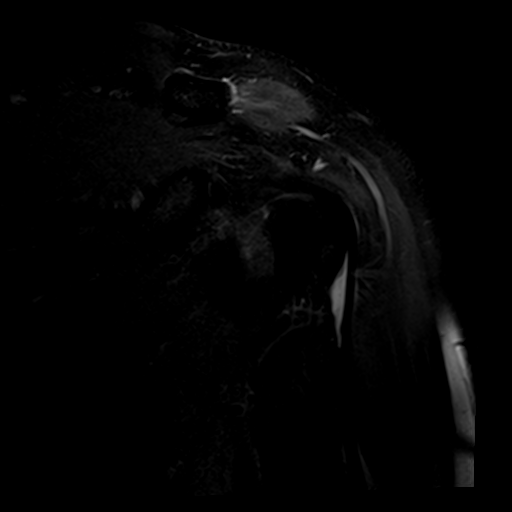
[im 8/19]
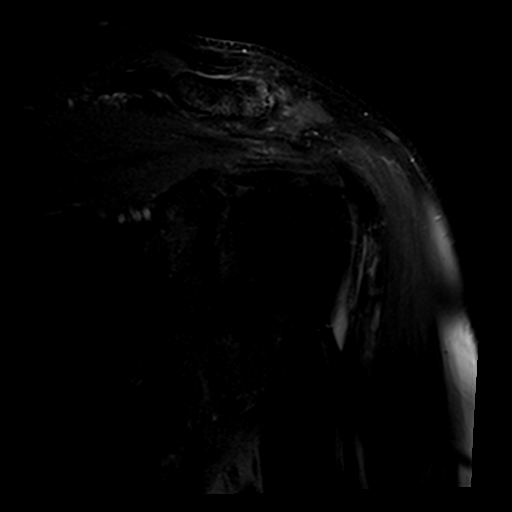
[im 11/19]
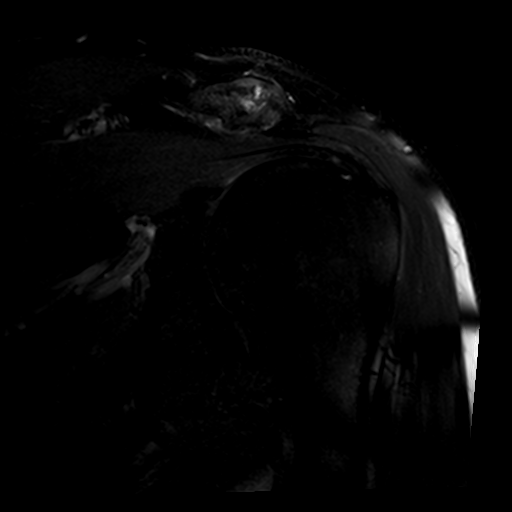
[im 16/19]
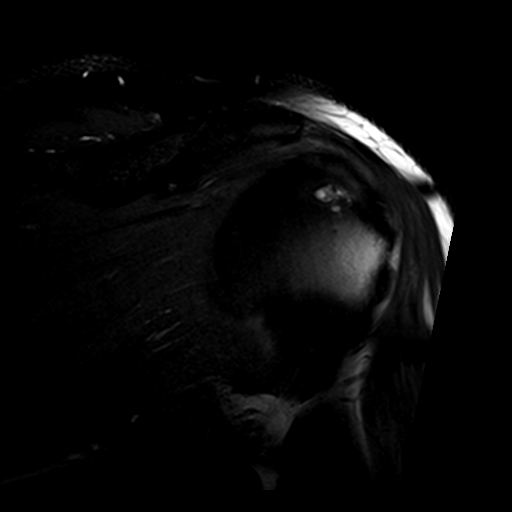

[30 of 40 positions shown; findings below may reference images not displayed]

FINDINGS: Field heterogeneity noted, this mildly adversely affects sensitivity
for subtle edema.

Rotator cuff:  Mild supraspinatus and infraspinatus tendinopathy.

Muscles:  Unremarkable

Biceps long head:  Unremarkable

Acromioclavicular Joint: Moderate subcortical marrow edema and mild
inferior spurring along the AC joint. Type III acromion. No
significant abnormal fluid in the subacromial subdeltoid bursa.

Glenohumeral Joint: Suspected focal synovitis anteriorly in the
joint, for example images 7-11 series 3. I do not see that this is
significantly extending into the rotator interval. Mildly thickened
inferior glenohumeral ligament. Moderate degenerative chondral
thinning in the glenohumeral joint with mild spurring of the humeral
head.

Labrum:  Grossly unremarkable

Bones: No significant extra-articular osseous abnormalities
identified.

Other: No supplemental non-categorized findings.
IMPRESSION: 1. The dominant finding is synovitis anteriorly in the shoulder
joint and possibly along the axillary pouch. These are findings
which can be present in adhesive capsulitis. However, I do not show
synovitis extending into the rotator interval.
2. Generally mild to moderate degenerative AC joint arthropathy and
degenerative glenohumeral arthropathy.
3. Mild supraspinatus and infraspinatus tendinopathy.
4. Unfavorable subacromial morphology.

## 2017-09-04 ENCOUNTER — Other Ambulatory Visit: Payer: Self-pay | Admitting: Student

## 2017-09-04 DIAGNOSIS — M542 Cervicalgia: Secondary | ICD-10-CM

## 2017-09-04 DIAGNOSIS — M5416 Radiculopathy, lumbar region: Secondary | ICD-10-CM

## 2017-09-12 ENCOUNTER — Ambulatory Visit
Admission: RE | Admit: 2017-09-12 | Discharge: 2017-09-12 | Disposition: A | Discharge status: Home / Self Care | Source: Ambulatory Visit | Attending: Student | Admitting: Student

## 2017-09-12 DIAGNOSIS — M5416 Radiculopathy, lumbar region: Secondary | ICD-10-CM

## 2017-09-12 DIAGNOSIS — M542 Cervicalgia: Secondary | ICD-10-CM

## 2017-09-12 MED ORDER — GADOBENATE DIMEGLUMINE 529 MG/ML IV SOLN
20.0000 mL | Freq: Once | INTRAVENOUS | Status: AC | PRN
  Administered 2017-09-12: 20 mL via INTRAVENOUS

## 2017-09-13 ENCOUNTER — Ambulatory Visit (INDEPENDENT_AMBULATORY_CARE_PROVIDER_SITE_OTHER): Admitting: Otolaryngology

## 2017-09-17 ENCOUNTER — Ambulatory Visit (INDEPENDENT_AMBULATORY_CARE_PROVIDER_SITE_OTHER): Admitting: Otolaryngology

## 2017-09-21 ENCOUNTER — Other Ambulatory Visit: Payer: Self-pay | Admitting: Student

## 2017-09-21 DIAGNOSIS — M5416 Radiculopathy, lumbar region: Secondary | ICD-10-CM

## 2017-09-21 DIAGNOSIS — M542 Cervicalgia: Secondary | ICD-10-CM

## 2017-10-01 ENCOUNTER — Ambulatory Visit
Admission: RE | Admit: 2017-10-01 | Discharge: 2017-10-01 | Disposition: A | Discharge status: Home / Self Care | Source: Ambulatory Visit | Attending: Student | Admitting: Student

## 2017-10-01 DIAGNOSIS — M542 Cervicalgia: Secondary | ICD-10-CM

## 2017-10-01 MED ORDER — TRIAMCINOLONE ACETONIDE 40 MG/ML IJ SUSP (RADIOLOGY)
60.0000 mg | Freq: Once | INTRAMUSCULAR | Status: AC
  Administered 2017-10-01: 60 mg via EPIDURAL

## 2017-10-01 MED ORDER — IOPAMIDOL (ISOVUE-M 300) INJECTION 61%
1.0000 mL | Freq: Once | INTRAMUSCULAR | Status: AC | PRN
  Administered 2017-10-01: 1 mL via EPIDURAL

## 2017-10-01 NOTE — Discharge Instructions (Signed)

## 2017-10-15 ENCOUNTER — Other Ambulatory Visit

## 2018-06-27 ENCOUNTER — Other Ambulatory Visit: Payer: Self-pay

## 2018-06-27 ENCOUNTER — Ambulatory Visit
Admission: RE | Admit: 2018-06-27 | Discharge: 2018-06-27 | Disposition: A | Discharge status: Home / Self Care | Payer: TRICARE For Life (TFL) | Source: Ambulatory Visit | Attending: Internal Medicine | Admitting: Internal Medicine

## 2018-06-27 ENCOUNTER — Other Ambulatory Visit: Payer: Self-pay | Admitting: Internal Medicine

## 2018-06-27 DIAGNOSIS — M549 Dorsalgia, unspecified: Secondary | ICD-10-CM

## 2018-06-27 DIAGNOSIS — M1990 Unspecified osteoarthritis, unspecified site: Secondary | ICD-10-CM

## 2018-06-27 DIAGNOSIS — M2560 Stiffness of unspecified joint, not elsewhere classified: Secondary | ICD-10-CM

## 2020-11-24 ENCOUNTER — Emergency Department (HOSPITAL_COMMUNITY): Payer: Medicare Other

## 2020-11-24 ENCOUNTER — Other Ambulatory Visit: Payer: Self-pay

## 2020-11-24 ENCOUNTER — Emergency Department (HOSPITAL_COMMUNITY)
Admission: EM | Admit: 2020-11-24 | Discharge: 2020-11-24 | Disposition: A | Discharge status: Home / Self Care | Payer: Medicare Other | Attending: Emergency Medicine | Admitting: Emergency Medicine

## 2020-11-24 ENCOUNTER — Encounter (HOSPITAL_COMMUNITY): Payer: Self-pay

## 2020-11-24 DIAGNOSIS — Z96643 Presence of artificial hip joint, bilateral: Secondary | ICD-10-CM | POA: Diagnosis not present

## 2020-11-24 DIAGNOSIS — F17210 Nicotine dependence, cigarettes, uncomplicated: Secondary | ICD-10-CM | POA: Insufficient documentation

## 2020-11-24 DIAGNOSIS — I10 Essential (primary) hypertension: Secondary | ICD-10-CM | POA: Insufficient documentation

## 2020-11-24 DIAGNOSIS — R079 Chest pain, unspecified: Secondary | ICD-10-CM | POA: Diagnosis present

## 2020-11-24 DIAGNOSIS — Z79899 Other long term (current) drug therapy: Secondary | ICD-10-CM | POA: Diagnosis not present

## 2020-11-24 LAB — BASIC METABOLIC PANEL
Anion gap: 10 (ref 5–15)
BUN: 15 mg/dL (ref 8–23)
CO2: 26 mmol/L (ref 22–32)
Calcium: 10.1 mg/dL (ref 8.9–10.3)
Chloride: 101 mmol/L (ref 98–111)
Creatinine, Ser: 1.29 mg/dL — ABNORMAL HIGH (ref 0.61–1.24)
GFR, Estimated: >60 mL/min (ref >60)
Glucose, Bld: 100 mg/dL — ABNORMAL HIGH (ref 70–99)
Potassium: 3.7 mmol/L (ref 3.5–5.1)
Sodium: 137 mmol/L (ref 135–145)

## 2020-11-24 LAB — CBC WITH DIFFERENTIAL/PLATELET
Abs Immature Granulocytes: 0.02 10*3/uL (ref 0.00–0.07)
Basophils Absolute: 0 10*3/uL (ref 0.0–0.1)
Basophils Relative: 0 %
Eosinophils Absolute: 0.2 10*3/uL (ref 0.0–0.5)
Eosinophils Relative: 3 %
HCT: 41.5 % (ref 39.0–52.0)
Hemoglobin: 13.7 g/dL (ref 13.0–17.0)
Immature Granulocytes: 0 %
Lymphocytes Relative: 40 %
Lymphs Abs: 2.7 10*3/uL (ref 0.7–4.0)
MCH: 29.3 pg (ref 26.0–34.0)
MCHC: 33 g/dL (ref 30.0–36.0)
MCV: 88.9 fL (ref 80.0–100.0)
Monocytes Absolute: 1 10*3/uL (ref 0.1–1.0)
Monocytes Relative: 14 %
Neutro Abs: 2.9 10*3/uL (ref 1.7–7.7)
Neutrophils Relative %: 43 %
Platelets: 218 10*3/uL (ref 150–400)
RBC: 4.67 MIL/uL (ref 4.22–5.81)
RDW: 13.9 % (ref 11.5–15.5)
WBC: 6.9 10*3/uL (ref 4.0–10.5)
nRBC: 0 % (ref 0.0–0.2)

## 2020-11-24 LAB — TROPONIN I (HIGH SENSITIVITY)
Troponin I (High Sensitivity): 8 ng/L (ref <18)
Troponin I (High Sensitivity): 7 ng/L (ref <18)

## 2020-11-24 NOTE — ED Provider Notes (Signed)
[facility] EMERGENCY DEPARTMENT Provider Note   CSN: [id number] Arrival date & time: 11/24/20  0032     History Chief Complaint  Patient presents with   Chest Pain    Terrance Snyder is a 65 y.o. male.   Chest Pain Associated symptoms: no abdominal pain, no back pain, no shortness of breath and no weakness   Patient presents with chest pain.  Anterior left chest.  It is sharp and comes and goes.  Lasts a couple seconds.  Not exertional.  No shortness of breath.  No rash.  No trauma.  No swelling his legs.  States has had this pain for couple days now.  States it would come on sometimes 15 minutes apart sometimes more quickly.  Pain-free now.  Unfortunately had a long wait before I had seen him in the ER.    Past Medical History:  Diagnosis Date   Diverticulosis    ED (erectile dysfunction)    GERD (gastroesophageal reflux disease)    Gout    Hyperlipidemia    Hypertension    Impaired fasting glucose    Osteoarthritis    Vallecular cyst     Patient Active Problem List   Diagnosis Date Noted   GERD (gastroesophageal reflux disease) 07/05/2013   Preoperative clearance 07/05/2013   Other and unspecified hyperlipidemia 10/18/2012   Essential hypertension, benign 10/18/2012   Erectile dysfunction 10/18/2012   Osteoarthritis 10/18/2012   Gout 10/18/2012   Myalgia and myositis 10/18/2012   Pain in joint 10/18/2012    Past Surgical History:  Procedure Laterality Date   ACHILLES TENDON SURGERY     Right   CERVICAL [place] SURGERY  12/2015   by Dr Saintclair Halsted at [place] N/A 01/17/2016   Procedure: DIRECT LARYNGOSCOPY;  Surgeon: Leta Baptist, MD;  Location: [facility];  Service: ENT;  Laterality: N/A;   JOINT REPLACEMENT Bilateral    hip replacement   MASS EXCISION N/A 01/17/2016   Procedure: EXCISION MASS;  Surgeon: Leta Baptist, MD;  Location: [facility];  Service: ENT;  Laterality: N/A;   TOTAL HIP ARTHROPLASTY Right  2005       Family History  Problem Relation Age of Onset   CVA Mother    Rheum arthritis Mother    Hyperlipidemia Mother    Hyperlipidemia Father    Cancer Father        Colon Cancer    Social History   Tobacco Use   Smoking status: Every Day    Packs/day: 0.50    Types: Cigarettes   Smokeless tobacco: Never  Substance Use Topics   Alcohol use: Yes    Comment: social   Drug use: No    Home Medications Prior to Admission medications   Medication Sig Start Date End Date Taking? Authorizing Provider  cholecalciferol (VITAMIN D) 1000 units tablet Take 1,000 Units by mouth daily.    [provider]  ezetimibe (ZETIA) 10 MG tablet Take 1 tablet (10 mg total) by mouth daily. 02/06/13 01/17/16  Jonathon Resides, MD  hydrochlorothiazide (HYDRODIURIL) 25 MG tablet Take 25 mg by mouth daily.    [provider]  ibuprofen (ADVIL,MOTRIN) 200 MG tablet Take 200 mg by mouth every 6 (six) hours as needed.    [provider]  Omega-3 Fatty Acids (FISH OIL) 1000 MG CAPS Take by mouth.    [provider]  omeprazole (PRILOSEC) 20 MG capsule Take 1 capsule (20 mg total) by mouth  2 (two) times daily before a meal. 02/06/13 01/10/16  Zanard, Bernadene Bell, MD  oxyCODONE-acetaminophen (ROXICET) 5-325 MG tablet Take 1 tablet by mouth every 4 (four) hours as needed for severe pain. 01/17/16   Leta Baptist, MD  Pitavastatin Calcium (LIVALO) 4 MG TABS Take by mouth.    [provider]    Allergies    Shellfish allergy  Review of Systems   Review of Systems  Constitutional:  Negative for appetite change.  HENT:  Negative for congestion.   Respiratory:  Negative for shortness of breath.   Cardiovascular:  Positive for chest pain.  Gastrointestinal:  Negative for abdominal pain.  Genitourinary:  Negative for flank pain.  Musculoskeletal:  Negative for back pain.  Skin:  Negative for rash.  Neurological:  Negative for weakness.  Psychiatric/Behavioral:   Negative for confusion.    Physical Exam Updated Vital Signs BP (!) 141/79 (BP Location: Left Arm)   Pulse (!) 58   Temp 98.9 F (37.2 C) (Oral)   Resp 18   SpO2 100%   Physical Exam Vitals and nursing note reviewed.  Constitutional:      Appearance: He is well-developed.  HENT:     Head: Normocephalic.  Cardiovascular:     Rate and Rhythm: Normal rate and regular rhythm.  Pulmonary:     Breath sounds: No wheezing.  Chest:     Chest wall: No tenderness.  Abdominal:     Tenderness: There is no abdominal tenderness.  Musculoskeletal:     Cervical back: Neck supple.     Right lower leg: No tenderness. No edema.     Left lower leg: No tenderness. No edema.  Skin:    General: Skin is warm.     Capillary Refill: Capillary refill takes less than 2 seconds.  Neurological:     Mental Status: He is alert.    ED Results / Procedures / Treatments   Labs (all labs ordered are listed, but only abnormal results are displayed) Labs Reviewed  BASIC METABOLIC PANEL - Abnormal; Notable for the following components:      Result Value   Glucose, Bld 100 (*)    Creatinine, Ser 1.29 (*)    All other components within normal limits  CBC WITH DIFFERENTIAL/PLATELET  TROPONIN I (HIGH SENSITIVITY)  TROPONIN I (HIGH SENSITIVITY)    EKG EKG Interpretation  Date/Time:  Wednesday November 24 2020 00:43:36 EDT Ventricular Rate:  62 PR Interval:  164 QRS Duration: 86 QT Interval:  410 QTC Calculation: 416 R Axis:   56 Text Interpretation: Normal sinus rhythm Normal ECG No significant change since last tracing Confirmed by Davonna Belling [phone]) on 11/24/2020 9:24:14 AM  Radiology DG Chest 2 View  Result Date: 11/24/2020 CLINICAL DATA:  Chest pain for a few hours EXAM: CHEST - 2 VIEW COMPARISON:  None. FINDINGS: The heart size and mediastinal contours are within normal limits. Both lungs are clear. The visualized skeletal structures are unremarkable. IMPRESSION: No active  cardiopulmonary disease. Electronically Signed   By: Inez Catalina M.D.   On: 11/24/2020 01:23    Procedures Procedures   Medications Ordered in ED Medications - No data to display  ED Course  I have reviewed the triage vital signs and the nursing notes.  Pertinent labs & imaging results that were available during my care of the patient were reviewed by me and considered in my medical decision making (see chart for details).    MDM Rules/Calculators/A&P  Patient with episodic sharp left-sided chest pain.  Pain-free now.  EKG reassuring.  Troponin negative x2.  Low risk from a cardiac standpoint.  Doubt aortic dissection.  Doubt pulm embolism.  Doubt pneumonia.  No rash although could be an early shingles patient appears stable for discharge.  Outpatient follow-up as needed Final Clinical Impression(s) / ED Diagnoses Final diagnoses:  Nonspecific chest pain    Rx / DC Orders ED Discharge Orders     None        Davonna Belling, MD 11/24/20 [phone]

## 2020-11-24 NOTE — ED Triage Notes (Signed)
Pt reports that when he went to lay down to go to sleep he began to have L sided CP, pt reports working in the yard today as well.

## 2020-11-24 NOTE — ED Provider Notes (Signed)
Emergency Medicine Provider Triage Evaluation Note  Terrance Snyder , a 65 y.o. male  was evaluated in triage.  Pt complains of chest pain.  Began tonight when putting on his CPAP.  Did yard work earlier today but felt fine during that.  No cough or SOB.  Has history of LVH but no CAD, CHF, etc.  He is a smoker.  Review of Systems  Positive: Chest pain Negative: Fever, cough  Physical Exam  BP (!) 167/81   Pulse (!) 59   Resp 16   SpO2 100%  Gen:   Awake, no distress   Resp:  Normal effort  MSK:   Moves extremities without difficulty  Other:    Medical Decision Making  Medically screening exam initiated at 12:47 AM.  Appropriate orders placed.  Miki Oncologist was informed that the remainder of the evaluation will be completed by another provider, this initial triage assessment does not replace that evaluation, and the importance of remaining in the ED until their evaluation is complete.  Chest pain  Cardiac work-up started including EKG, labs, CXR.   Larene Pickett, PA-C 11/24/20 0048    Drenda Freeze, MD 11/24/20 [phone]

## 2021-06-15 ENCOUNTER — Other Ambulatory Visit: Payer: Self-pay | Admitting: Neurosurgery

## 2021-06-15 DIAGNOSIS — M5416 Radiculopathy, lumbar region: Secondary | ICD-10-CM

## 2021-06-15 DIAGNOSIS — M5412 Radiculopathy, cervical region: Secondary | ICD-10-CM

## 2021-06-29 ENCOUNTER — Ambulatory Visit
Admission: RE | Admit: 2021-06-29 | Discharge: 2021-06-29 | Disposition: A | Discharge status: Home / Self Care | Payer: Medicare Other | Source: Ambulatory Visit | Attending: Neurosurgery | Admitting: Neurosurgery

## 2021-06-29 DIAGNOSIS — M5416 Radiculopathy, lumbar region: Secondary | ICD-10-CM

## 2021-06-29 DIAGNOSIS — M5412 Radiculopathy, cervical region: Secondary | ICD-10-CM

## 2021-09-20 ENCOUNTER — Ambulatory Visit: Payer: Medicare Other | Admitting: Podiatry

## 2021-09-29 ENCOUNTER — Ambulatory Visit (INDEPENDENT_AMBULATORY_CARE_PROVIDER_SITE_OTHER): Payer: Medicare Other

## 2021-09-29 ENCOUNTER — Ambulatory Visit (INDEPENDENT_AMBULATORY_CARE_PROVIDER_SITE_OTHER): Payer: Medicare Other | Admitting: Podiatry

## 2021-09-29 DIAGNOSIS — M779 Enthesopathy, unspecified: Secondary | ICD-10-CM

## 2021-09-29 DIAGNOSIS — M19079 Primary osteoarthritis, unspecified ankle and foot: Secondary | ICD-10-CM

## 2021-09-29 DIAGNOSIS — M19072 Primary osteoarthritis, left ankle and foot: Secondary | ICD-10-CM | POA: Diagnosis not present

## 2021-09-29 DIAGNOSIS — B351 Tinea unguium: Secondary | ICD-10-CM | POA: Diagnosis not present

## 2021-09-29 DIAGNOSIS — L600 Ingrowing nail: Secondary | ICD-10-CM

## 2021-09-29 DIAGNOSIS — G629 Polyneuropathy, unspecified: Secondary | ICD-10-CM | POA: Diagnosis not present

## 2021-09-29 MED ORDER — CEPHALEXIN 500 MG PO CAPS: 500.0000 mg | ORAL_CAPSULE | Freq: Three times a day (TID) | ORAL | 0 refills | Status: DC

## 2021-09-29 NOTE — Progress Notes (Signed)
Subjective:   Patient ID: Terrance Snyder, male   DOB: 66 y.o.   MRN: [id number]   HPI Chief Complaint  Patient presents with   Foot Problem    not diabetic -  neuropathy BIL, has been going on for several years now    66 year old male presents with multiple concerns. He states that he has neuropathy. He has seen neurology and rheumatology. He states in May he developed swelling in his feet. He saw a DPM and had x-rays, shots, oral steroids and ibuprofen. He was suppose to go out of the country but couldn't go because of these symptoms. He tried soaking and ice his feet as well as heat pack and that helps some. He states when this flared up it started working and was on his feet more. He thought it was a sitting job but was walking more than he thought.   He gets tingling and numbness to his feet which has been ongoing for a while but has been getting worse since May. He was diagnosed with neuropathy by his neurologist.  He has been on gabapentin and lyrica. Not sure that it helps.   He sees rheumatology for arthritis. He states he has osteoarthritis and Rheumatoid Arthritis.   He wraps his feet in castor oil and some other creams.   He also has nail fungus and uses tea tree oil.  Has has an ingrown toenail on the left big toe. It gets painful at times but no swelling or redness.  He had to have a procedure performed today to remove the ingrown toenail.   Review of Systems  All other systems reviewed and are negative.  Past Medical History:  Diagnosis Date   Diverticulosis    ED (erectile dysfunction)    GERD (gastroesophageal reflux disease)    Gout    Hyperlipidemia    Hypertension    Impaired fasting glucose    Osteoarthritis    Vallecular cyst     Past Surgical History:  Procedure Laterality Date   ACHILLES TENDON SURGERY     Right   CERVICAL [place] SURGERY  12/2015   by Dr Saintclair Halsted at [facility] N/A 01/17/2016   Procedure: DIRECT LARYNGOSCOPY;  Surgeon: Leta Baptist, MD;  Location: [facility];  Service: ENT;  Laterality: N/A;   JOINT REPLACEMENT Bilateral    hip replacement   MASS EXCISION N/A 01/17/2016   Procedure: EXCISION MASS;  Surgeon: Leta Baptist, MD;  Location: [place];  Service: ENT;  Laterality: N/A;   TOTAL HIP ARTHROPLASTY Right 2005     Current Outpatient Medications:    amLODipine (NORVASC) 10 MG tablet, TAKE 1 TABLET(10 MG) BY MOUTH DAILY, Disp: , Rfl:    cephALEXin (KEFLEX) 500 MG capsule, Take 1 capsule (500 mg total) by mouth 3 (three) times daily., Disp: 21 capsule, Rfl: 0   cholecalciferol (VITAMIN D) 1000 units tablet, Take 1,000 Units by mouth daily., Disp: , Rfl:    diclofenac Sodium (VOLTAREN) 1 % GEL, 2 gm up to 2-3 times, Disp: , Rfl:    Evolocumab 140 MG/ML SOAJ, Inject into the skin., Disp: , Rfl:    famotidine (PEPCID) 40 MG tablet, TAKE 1 TABLET NIGHTLY TO PROTECT STOMACH., Disp: , Rfl:    gabapentin (NEURONTIN) 300 MG capsule, TK 1 C PO ATN, Disp: , Rfl:    HUMIRA PEN 40 MG/0.4ML PNKT, SMARTSIG:40 Milligram(s) SUB-Q Every 2 Weeks, Disp: , Rfl:    hydrochlorothiazide (HYDRODIURIL) 25 MG  tablet, Take 25 mg by mouth daily., Disp: , Rfl:    ibuprofen (ADVIL,MOTRIN) 200 MG tablet, Take 200 mg by mouth every 6 (six) hours as needed., Disp: , Rfl:    MICARDIS HCT 80-25 MG tablet, Take 1 tablet by mouth daily., Disp: , Rfl:    Multiple Vitamin (MULTI-VITAMIN) tablet, Take 1 tablet by mouth daily., Disp: , Rfl:    olmesartan-hydrochlorothiazide (BENICAR HCT) 40-25 MG tablet, Take by mouth., Disp: , Rfl:    Omega-3 Fatty Acids (FISH OIL) 1000 MG CAPS, Take by mouth., Disp: , Rfl:    oxyCODONE-acetaminophen (ROXICET) 5-325 MG tablet, Take 1 tablet by mouth every 4 (four) hours as needed for severe pain., Disp: 15 tablet, Rfl: 0   Pitavastatin Calcium (LIVALO) 4 MG TABS, Take by mouth., Disp: , Rfl:    pregabalin (LYRICA) 75 MG capsule, Take 75 mg by mouth 2 (two) times daily., Disp: , Rfl:     ezetimibe (ZETIA) 10 MG tablet, Take 1 tablet (10 mg total) by mouth daily., Disp: 90 tablet, Rfl: 3   omeprazole (PRILOSEC) 20 MG capsule, Take 1 capsule (20 mg total) by mouth 2 (two) times daily before a meal., Disp: 180 capsule, Rfl: 3  Allergies  Allergen Reactions   Shellfish Allergy Nausea And Vomiting           Objective:  Physical Exam  General: AAO x3, NAD  Dermatological: Nails appear to be hypertrophic, dystrophic with subungual debris.  There is incurvation present to left lateral nail border with tenderness palpation there is no edema, erythema or signs of infection.  There is no open lesions.  Vascular: Dorsalis Pedis artery and Posterior Tibial artery pedal pulses are 2/4 bilateral with immedate capillary fill time.  There is no pain with calf compression, swelling, warmth, erythema.   Neruologic: Sensation decreased with Thornell Mule monofilament  Musculoskeletal: Hammertoes are present.  There is no specific area pinpoint tenderness.  There is mild chronic edema to the feet there is no erythema or warmth noted today.  Muscular strength 5/5 in all groups tested bilateral.  Gait: Unassisted, Nonantalgic.       Assessment:   Left lateral ingrown, Arthritis, neuropathy, onychomycosis    Plan:  -Treatment options discussed including all alternatives, risks, and complications. -Etiology of symptoms were discussed -X-rays were obtained and reviewed with the patient.  3 views of bilateral feet were obtained.  Arthritis present right first MPJ.  Calcification present along the right Achilles tendon mid substance.  He does have a history of Achilles tendon rupture.  Midfoot arthritis is also present on the left side.  No evidence of acute fracture. -Think his symptoms are multifactorial. This is from arthritis and neuropathy which is already being treated for.  Continue current treatments we discussed supportive shoe gear. -At this time, the patient is requesting  partial nail removal with chemical matricectomy to the symptomatic portion of the nail. Risks and complications were discussed with the patient for which they understand and written consent was obtained. Under sterile conditions a total of 3 mL of a mixture of 2% lidocaine plain and 0.5% Marcaine plain was infiltrated in a hallux block fashion. Once anesthetized, the skin was prepped in sterile fashion. A tourniquet was then applied. Next the lateral aspect of hallux nail border was then sharply excised making sure to remove the entire offending nail border. Once the nails were ensured to be removed area was debrided and the underlying skin was intact. There is no purulence identified in  the procedure. Next phenol was then applied under standard conditions and copiously irrigated. Silvadene was applied. A dry sterile dressing was applied. After application of the dressing the tourniquet was removed and there is found to be an immediate capillary refill time to the digit. The patient tolerated the procedure well any complications. Post procedure instructions were discussed the patient for which he verbally understood. Follow-up in one week for nail check or sooner if any problems are to arise. Discussed signs/symptoms of infection and directed to call the office immediately should any occur or go directly to the emergency room. In the meantime, encouraged to call the office with any questions, concerns, changes symptoms. -We will also proceed with treatment for the nail fungus would likely need to do oral medication however given his other medications, hold off on this.  Ordered a compound cream through [employer] for nail fungus.  Will this well with the fungus as well as dystrophy of the toenail.  Trula Slade DPM

## 2021-09-29 NOTE — Patient Instructions (Signed)

## 2021-10-13 ENCOUNTER — Ambulatory Visit (INDEPENDENT_AMBULATORY_CARE_PROVIDER_SITE_OTHER): Payer: Medicare Other | Admitting: Podiatry

## 2021-10-13 ENCOUNTER — Telehealth: Payer: Self-pay

## 2021-10-13 DIAGNOSIS — L600 Ingrowing nail: Secondary | ICD-10-CM

## 2021-10-13 NOTE — Progress Notes (Signed)
Patient seen at the office for nail check to left hallux. Procedure was done on 09/29/21. No signs of infection at this time. Patient is soaking occasionally.  Patient denies any fever, nausea, vomiting, chills or chest pain at this time.   Advise patient to continue to monitor toe bed. If any signs and symptoms of infection occur to go to the ED. If she has any questions or concerns to contact the office.   Patient inquiring about a compound cream for nail fungus. Patient stated he has not [facility] the [facility]. Will notify provider in order to reorder medication to the correct pharmacy.

## 2021-10-20 NOTE — Telephone Encounter (Signed)
Left voicemail for patient to call office or to view myChart message.

## 2022-03-03 ENCOUNTER — Ambulatory Visit (INDEPENDENT_AMBULATORY_CARE_PROVIDER_SITE_OTHER): Payer: Medicare Other | Admitting: Podiatry

## 2022-03-03 ENCOUNTER — Ambulatory Visit: Payer: Medicare Other

## 2022-03-03 DIAGNOSIS — M779 Enthesopathy, unspecified: Secondary | ICD-10-CM | POA: Diagnosis not present

## 2022-03-03 DIAGNOSIS — M19071 Primary osteoarthritis, right ankle and foot: Secondary | ICD-10-CM

## 2022-03-03 DIAGNOSIS — M19079 Primary osteoarthritis, unspecified ankle and foot: Secondary | ICD-10-CM | POA: Diagnosis not present

## 2022-03-03 DIAGNOSIS — M7989 Other specified soft tissue disorders: Secondary | ICD-10-CM | POA: Diagnosis not present

## 2022-03-03 DIAGNOSIS — M19072 Primary osteoarthritis, left ankle and foot: Secondary | ICD-10-CM | POA: Diagnosis not present

## 2022-03-03 NOTE — Progress Notes (Signed)
Subjective: Chief Complaint  Patient presents with   Peripheral Neuropathy    Bilateral feet    67 year old male presents the office today for concerns.  He states 2 weeks ago when he made the appointment he was not able to put weight on the foot. They thought he had gout but the uric acid was normal. The pain was along the anterior ankle and along the heel. He tries to soak and also ice/heat. Pain started one morning when he got up. No injuries.  It is better than what is was but still tender.   He has swelling to the right ankle and there is a knot on the front if the ankle.    Objective: AAO x3, NAD DP/PT pulses palpable bilaterally, CRT less than 3 seconds Soft tissue mass anterior ankle rght which appears to be firm and mobile.  Mild tenderness palpation of the area.  No edema, erythema. On the left ankle there is tenderness in the anterior aspect of the ankle along the course of the flexor, peroneal tendons as well.  Clinically the tendons appear to be intact.  There is edema present without any erythema or warmth. MMT 5/5. No pain with calf compression, swelling, warmth, erythema  Assessment: Left ankle swelling, possible gout; soft tissue mass right ankle  Plan: -All treatment options discussed with the patient including all alternatives, risks, complications.  -X-rays were obtained and reviewed bilaterally.  Multiple views of bilateral foot and ankles were obtained.  No evidence of acute fracture.  Calcaneal spurring, calcifications are present.  Midfoot arthritic changes present. -I still think some of the symptoms could be result of gout, arthritis in the left side.  Discussed steroid injection ultimately he wanted to hold off on this.  Discussed bracing, icing close offloading. -Will order ultrasound of the right ankle to evaluate the soft tissue mass. -Patient encouraged to call the office with any questions, concerns, change in symptoms.   Trula Slade DPM

## 2022-03-16 ENCOUNTER — Ambulatory Visit
Admission: RE | Admit: 2022-03-16 | Discharge: 2022-03-16 | Disposition: A | Discharge status: Home / Self Care | Payer: Medicare Other | Source: Ambulatory Visit | Attending: Podiatry | Admitting: Podiatry

## 2022-03-16 DIAGNOSIS — M7989 Other specified soft tissue disorders: Secondary | ICD-10-CM

## 2023-02-01 ENCOUNTER — Other Ambulatory Visit: Payer: Self-pay | Admitting: Student

## 2023-02-01 DIAGNOSIS — M5416 Radiculopathy, lumbar region: Secondary | ICD-10-CM

## 2023-02-23 ENCOUNTER — Ambulatory Visit
Admission: RE | Admit: 2023-02-23 | Discharge: 2023-02-23 | Disposition: A | Discharge status: Home / Self Care | Payer: Medicare Other | Source: Ambulatory Visit | Attending: Student | Admitting: Student

## 2023-02-23 DIAGNOSIS — M5416 Radiculopathy, lumbar region: Secondary | ICD-10-CM

## 2023-02-23 MED ORDER — GADOPICLENOL 0.5 MMOL/ML IV SOLN
10.0000 mL | Freq: Once | INTRAVENOUS | Status: AC | PRN
  Administered 2023-02-23: 10 mL via INTRAVENOUS

## 2023-03-15 ENCOUNTER — Other Ambulatory Visit: Payer: Self-pay | Admitting: Neurosurgery

## 2023-04-04 NOTE — Progress Notes (Signed)
Surgical Instructions   Your procedure is scheduled on Wednesday, April 11, 2023.Marland Kitchen Report to [facility] Main Entrance "A" at 7:30 A.M., then check in with the Admitting office. Any questions or running late day of surgery: call [phone]  Questions prior to your surgery date: call [phone], Monday-Friday, 8am-4pm. If you experience any cold or flu symptoms such as cough, fever, chills, shortness of breath, etc. between now and your scheduled surgery, please notify us at the above number.     Remember:  Do not eat or drink after midnight the night before your surgery   Take these medicines the morning of surgery with A SIP OF WATER  amLODipine (NORVASC)  ezetimibe (ZETIA)   Follow your surgeon's instructions on when to stop Humira and Repatha injections.  If no instructions were given by your surgeon then you will need to call the office to get those instructions.      One week prior to surgery, STOP taking any Aspirin (unless otherwise instructed by your surgeon) Aleve, Naproxen, Ibuprofen, Motrin, Advil, Goody's, BC's, all herbal medications, fish oil, and non-prescription vitamins. This includes your ibuprofen (ADVIL).                     Do NOT Smoke (Tobacco/Vaping) for 24 hours prior to your procedure.  If you use a CPAP at night, you may bring your mask/headgear for your overnight stay.   You will be asked to remove any contacts, glasses, piercing's, hearing aid's, dentures/partials prior to surgery. Please bring cases for these items if needed.    Patients discharged the day of surgery will not be allowed to drive home, and someone needs to stay with them for 24 hours.  SURGICAL WAITING ROOM VISITATION Patients may have no more than 2 support people in the waiting area - these visitors may rotate.   Pre-op nurse will coordinate an appropriate time for 1 ADULT support person, who may not rotate, to accompany patient in pre-op.  Children under the age of 75 must  have an adult with them who is not the patient and must remain in the main waiting area with an adult.  If the patient needs to stay at the hospital during part of their recovery, the visitor guidelines for inpatient rooms apply.  Please refer to the [facility] website for the visitor guidelines for any additional information.   If you received a COVID test during your pre-op visit  it is requested that you wear a mask when out in public, stay away from anyone that may not be feeling well and notify your surgeon if you develop symptoms. If you have been in contact with anyone that has tested positive in the last 10 days please notify you surgeon.      Pre-operative 5 CHG Bathing Instructions   You can play a key role in reducing the risk of infection after surgery. Your skin needs to be as free of germs as possible. You can reduce the number of germs on your skin by washing with CHG (chlorhexidine gluconate) soap before surgery. CHG is an antiseptic soap that kills germs and continues to kill germs even after washing.   DO NOT use if you have an allergy to chlorhexidine/CHG or antibacterial soaps. If your skin becomes reddened or irritated, stop using the CHG and notify one of our RNs at [phone].   Please shower with the CHG soap starting 4 days before surgery using the following schedule:     Please  keep in mind the following:  DO NOT shave, including legs and underarms, starting the day of your first shower.   You may shave your face at any point before/day of surgery.  Place clean sheets on your bed the day you start using CHG soap. Use a clean washcloth (not used since being washed) for each shower. DO NOT sleep with pets once you start using the CHG.   CHG Shower Instructions:  Wash your face and private area with normal soap. If you choose to wash your hair, wash first with your normal shampoo.  After you use shampoo/soap, rinse your hair and body thoroughly to remove  shampoo/soap residue.  Turn the water OFF and apply about 3 tablespoons (45 ml) of CHG soap to a CLEAN washcloth.  Apply CHG soap ONLY FROM YOUR NECK DOWN TO YOUR TOES (washing for 3-5 minutes)  DO NOT use CHG soap on face, private areas, open wounds, or sores.  Pay special attention to the area where your surgery is being performed.  If you are having back surgery, having someone wash your back for you may be helpful. Wait 2 minutes after CHG soap is applied, then you may rinse off the CHG soap.  Pat dry with a clean towel  Put on clean clothes/pajamas   If you choose to wear lotion, please use ONLY the CHG-compatible lotions that are listed below.  Additional instructions for the day of surgery: DO NOT APPLY any lotions, deodorants, cologne, or perfumes.   Do not bring valuables to the hospital. [facility] is not responsible for any belongings/valuables. Do not wear nail polish, gel polish, artificial nails, or any other type of covering on natural nails (fingers and toes) Do not wear jewelry or makeup Put on clean/comfortable clothes.  Please brush your teeth.  Ask your nurse before applying any prescription medications to the skin.     CHG Compatible Lotions   Aveeno Moisturizing lotion  Cetaphil Moisturizing Cream  Cetaphil Moisturizing Lotion  Clairol Herbal Essence Moisturizing Lotion, Dry Skin  Clairol Herbal Essence Moisturizing Lotion, Extra Dry Skin  Clairol Herbal Essence Moisturizing Lotion, Normal Skin  Curel Age Defying Therapeutic Moisturizing Lotion with Alpha Hydroxy  Curel Extreme Care Body Lotion  Curel Soothing Hands Moisturizing Hand Lotion  Curel Therapeutic Moisturizing Cream, Fragrance-Free  Curel Therapeutic Moisturizing Lotion, Fragrance-Free  Curel Therapeutic Moisturizing Lotion, Original Formula  Eucerin Daily Replenishing Lotion  Eucerin Dry Skin Therapy Plus Alpha Hydroxy Crme  Eucerin Dry Skin Therapy Plus Alpha Hydroxy Lotion  Eucerin  Original Crme  Eucerin Original Lotion  Eucerin Plus Crme Eucerin Plus Lotion  Eucerin TriLipid Replenishing Lotion  Keri Anti-Bacterial Hand Lotion  Keri Deep Conditioning Original Lotion Dry Skin Formula Softly Scented  Keri Deep Conditioning Original Lotion, Fragrance Free Sensitive Skin Formula  Keri Lotion Fast Absorbing Fragrance Free Sensitive Skin Formula  Keri Lotion Fast Absorbing Softly Scented Dry Skin Formula  Keri Original Lotion  Keri Skin Renewal Lotion Keri Silky Smooth Lotion  Keri Silky Smooth Sensitive Skin Lotion  Nivea Body Creamy Conditioning Oil  Nivea Body Extra Enriched Lotion  Nivea Body Original Lotion  Nivea Body Sheer Moisturizing Lotion Nivea Crme  Nivea Skin Firming Lotion  NutraDerm 30 Skin Lotion  NutraDerm Skin Lotion  NutraDerm Therapeutic Skin Cream  NutraDerm Therapeutic Skin Lotion  ProShield Protective Hand Cream  Provon moisturizing lotion  Please read over the following fact sheets that you were given.

## 2023-04-05 ENCOUNTER — Encounter (HOSPITAL_COMMUNITY): Payer: Self-pay

## 2023-04-05 ENCOUNTER — Encounter (HOSPITAL_COMMUNITY)
Admission: RE | Admit: 2023-04-05 | Discharge: 2023-04-05 | Disposition: A | Discharge status: Home / Self Care | Payer: Medicare Other | Source: Ambulatory Visit | Attending: Neurosurgery | Admitting: Neurosurgery

## 2023-04-05 VITALS — BP 173/74 | HR 86 | Temp 98.4°F | Resp 18 | Ht 75.0 in | Wt 273.7 lb

## 2023-04-05 DIAGNOSIS — Z01818 Encounter for other preprocedural examination: Secondary | ICD-10-CM

## 2023-04-05 DIAGNOSIS — M542 Cervicalgia: Secondary | ICD-10-CM | POA: Insufficient documentation

## 2023-04-05 HISTORY — DX: Other complications of anesthesia, initial encounter: T88.59XA

## 2023-04-05 HISTORY — DX: Sleep apnea, unspecified: G47.30

## 2023-04-05 LAB — BASIC METABOLIC PANEL
Anion gap: 7 (ref 5–15)
BUN: 10 mg/dL (ref 8–23)
CO2: 26 mmol/L (ref 22–32)
Calcium: 10.2 mg/dL (ref 8.9–10.3)
Chloride: 105 mmol/L (ref 98–111)
Creatinine, Ser: 1.19 mg/dL (ref 0.61–1.24)
GFR, Estimated: >60 mL/min (ref >60)
Glucose, Bld: 101 mg/dL — ABNORMAL HIGH (ref 70–99)
Potassium: 3.6 mmol/L (ref 3.5–5.1)
Sodium: 138 mmol/L (ref 135–145)

## 2023-04-05 LAB — TYPE AND SCREEN
ABO/RH(D): O POS
Antibody Screen: NEGATIVE

## 2023-04-05 LAB — CBC
HCT: 44.3 % (ref 39.0–52.0)
Hemoglobin: 14.7 g/dL (ref 13.0–17.0)
MCH: 28.8 pg (ref 26.0–34.0)
MCHC: 33.2 g/dL (ref 30.0–36.0)
MCV: 86.9 fL (ref 80.0–100.0)
Platelets: 268 10*3/uL (ref 150–400)
RBC: 5.1 MIL/uL (ref 4.22–5.81)
RDW: 13.4 % (ref 11.5–15.5)
WBC: 6.3 10*3/uL (ref 4.0–10.5)
nRBC: 0 % (ref 0.0–0.2)

## 2023-04-05 LAB — SURGICAL PCR SCREEN
MRSA, PCR: NEGATIVE
Staphylococcus aureus: NEGATIVE

## 2023-04-05 NOTE — Progress Notes (Signed)
PCP - Dr. Adelene Amas Cardiologist - Dr. Bertram Gala  - LOV 08-01-22  PPM/ICD - Denies Device Orders - n/a Rep Notified - n/a  Chest x-ray - n/a EKG - 08-01-22 Stress Test - Denes ECHO - 08-16-22 Cardiac Cath - Denies  Sleep Study - Denies CPAP - n/a  NON-diabetic  Last dose of GLP1 agonist-  Denies GLP1 instructions: n/a  Blood Thinner Instructions: Denies Aspirin Instructions: Denies taking - instructed not to take prior to procedure  ERAS Protcol - NPO PRE-SURGERY Ensure or G2- none  COVID TEST- No   Anesthesia review: Yes, EKG requested, HTN, BPD, difficulty with intubation in 2017. Pt states when they started to intubate him they couldn't because he had a mass on in the inside. He has since had that removed.   Patient denies shortness of breath, fever, cough and chest pain at PAT appointment. Patient denies any respiratory issues at this time.    All instructions explained to the patient, with a verbal understanding of the material. Patient agrees to go over the instructions while at home for a better understanding. Patient also instructed to self quarantine after being tested for COVID-19. The opportunity to ask questions was provided.

## 2023-04-06 NOTE — Anesthesia Preprocedure Evaluation (Addendum)
Anesthesia Evaluation  Patient identified by MRN, date of birth, ID band Patient awake    Reviewed: Allergy & Precautions, NPO status , Patient's Chart, lab work & pertinent test results  History of Anesthesia Complications Negative for: history of anesthetic complications  Airway Mallampati: III  TM Distance: >3 FB Neck ROM: Full    Dental  (+) Dental Advisory Given   Pulmonary sleep apnea , Current Smoker and Patient abstained from smoking.   breath sounds clear to auscultation       Cardiovascular hypertension, Pt. on medications  Rhythm:Regular     Neuro/Psych  Neuromuscular disease    GI/Hepatic Neg liver ROS,GERD  ,,  Endo/Other    Renal/GU negative Renal ROS     Musculoskeletal  (+) Arthritis ,    Abdominal   Peds  Hematology   Anesthesia Other Findings   Reproductive/Obstetrics                             Anesthesia Physical Anesthesia Plan  ASA: 2  Anesthesia Plan: General   Post-op Pain Management: Ofirmev IV (intra-op)*   Induction: Intravenous  PONV Risk Score and Plan: 2 and Ondansetron and Dexamethasone  Airway Management Planned: Oral ETT  Additional Equipment: None  Intra-op Plan:   Post-operative Plan: Extubation in OR  Informed Consent: I have reviewed the patients History and Physical, chart, labs and discussed the procedure including the risks, benefits and alternatives for the proposed anesthesia with the patient or authorized representative who has indicated his/her understanding and acceptance.     Dental advisory given  Plan Discussed with: CRNA  Anesthesia Plan Comments: (PAT note written 04/06/2023 by Shonna Chock, PA-C.  )       Anesthesia Quick Evaluation

## 2023-04-06 NOTE — Progress Notes (Signed)
Anesthesia Chart Review:   Case: [id number] Date/Time: 04/11/23 0915   Procedure: ACDF - C6-C7 - 3C   Anesthesia type: General   Pre-op diagnosis: Cervicalgia   Location: [facility] ROOM 20 / [facility]   Surgeons: Donalee Citrin, MD       DISCUSSION: Patient is a 68 year old male scheduled for the above procedure.  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  He had an elective Glidescope intubation on 01/16/26 at the time of left vallecular cyst excision surgery due to a recent cervical neck surgery and limited head and neck ROM.   He is followed by [facility] cardiologist Dr. Karren Burly for LVH, HTN, HLD, smoking. Last visit 08/01/22 for annual follow-up. Overall compliant with antihypertensive regimen. Lipids at goal. Had tried Chantix for smoking cessation but went back to smoking. He was asymptomatic from a CV/HF standpoint. EKG showed SR with LVH by voltage. Last echo was in 2020, so he recommended update which was done on 08/16/22 and showed LVEF 50-55%, borderline concentric LVH, normal LV diastolic function, normal RVF, mild TR, RVSP 24 mmHg. One year office follow-up recommended and consideration of repeat echo in 3-5 years.   Anesthesia team to evaluate on the day of surgery.   VS: BP (!) 173/74   Pulse 86   Temp 36.9 C   Resp 18   Ht 6\' 3"  (1.905 m)   Wt 124.1 kg   SpO2 99%   BMI 34.21 kg/m   PROVIDERS: Lazoff, Shawn P, DO is PCP Bertram Gala, MD is cardiologist Concepcion Living, MD is rheumatologist  LABS: Labs reviewed: Acceptable for surgery. (all labs ordered are listed, but only abnormal results are displayed)  Labs Reviewed  BASIC METABOLIC PANEL - Abnormal; Notable for the following components:      Result Value   Glucose, Bld 101 (*)    All other components within normal limits  SURGICAL PCR SCREEN  CBC  TYPE AND SCREEN      IMAGES: MRI L-spine 02/23/23: IMPRESSION: 1. 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 2. Multilevel neural foraminal narrowing, as described above, severe on the left at L3-4.   CXR 01/08/23 ([facility]): FINDINGS:  Cardiovascular: Cardiac silhouette and pulmonary vasculature are within normal limits. Tortuous aorta.  Mediastinum: Within normal limits.  Lungs/pleura: Clear.  Upper abdomen: Visualized portions are unremarkable.  Chest wall/osseous structures: Loss of normal thoracic kyphosis.  IMPRESSION: There is no evidence of acute cardiac or pulmonary abnormality.    EKG: EKG 08/01/22 ([facility]): Scanned under Media tab, AMB Correspondence, 04/05/23. Sinus rhythm  Moderate voltage criteria for LVH  Otherwise normal  When compared with ECG of 03-Dec-2018 13 47,  No significant change was found  Confirmed by Tawana Scale  [id number]  on 08-03-2022 12 48 30 PM   CV: Echo 08/16/22 ([facility]): SUMMARY  Recommend follow up transthoracic echocardiogram in every 3-5 years for routine surveillance of mild  valvular heart disease. Change in cardiac exam or clinical status may warrant earlier reassessment.  2020 ACC-AHA Guideline  Circ. vol 143, e72-e227.  The left ventricular size is normal. There is borderline concentric left ventricular hypertrophy.  The left ventricular wall motion is normal. Left ventricular systolic function is low normal. LV  ejection fraction = 50-55%.  Normal left ventricular diastolic function and  left atrial pressure.  The right ventricle is normal in size and function.  The atria are normal in size.  There is mild tricuspid regurgitation.  No pulmonary hypertension. Estimated right ventricular systolic pressure is 24 mmHg.  Normal IVC size and collapsibility; Right atrial pressure is estimated to be 3 mm Hg.  The aortic sinus is  normal size.  There is no comparison study available.   Past Medical History:  Diagnosis Date   Complication of anesthesia    2017unable to intubating d/t a mass which has since been removed.   Diverticulosis    ED (erectile dysfunction)    GERD (gastroesophageal reflux disease)    Gout    Hyperlipidemia    Hypertension    Impaired fasting glucose    Osteoarthritis    Sleep apnea    wears CPAP   Vallecular cyst     Past Surgical History:  Procedure Laterality Date   ACHILLES TENDON SURGERY     Right   CERVICAL DISC SURGERY  12/2015   by Dr Wynetta Emery at [facility]   DIRECT LARYNGOSCOPY N/A 01/17/2016   Procedure: DIRECT LARYNGOSCOPY;  Surgeon: Newman Pies, MD;  Location: [facility];  Service: ENT;  Laterality: N/A;   JOINT REPLACEMENT Bilateral    hip replacement   MASS EXCISION N/A 01/17/2016   Procedure: EXCISION MASS;  Surgeon: Newman Pies, MD;  Location: [facility];  Service: ENT;  Laterality: N/A;   TOTAL HIP ARTHROPLASTY Right 2005    MEDICATIONS:  amLODipine (NORVASC) 10 MG tablet   b complex vitamins capsule   cephALEXin (KEFLEX) 500 MG capsule   ezetimibe (ZETIA) 10 MG tablet   HUMIRA PEN 40 MG/0.4ML PNKT   ibuprofen (ADVIL) 800 MG tablet   MICARDIS HCT 80-25 MG tablet   omeprazole (PRILOSEC) 20 MG capsule   oxyCODONE-acetaminophen (ROXICET) 5-325 MG tablet   REPATHA SURECLICK 140 MG/ML SOAJ   No current facility-administered medications for this encounter.   He is not currently taking Keflex, Roxicet, omeprazole, and ibuprofen is on hold.   Shonna Chock, PA-C Surgical Short Stay/Anesthesiology [facility] Phone [phone] [facility] Phone [phone] 04/06/2023 2:54 PM

## 2023-04-11 ENCOUNTER — Ambulatory Visit (HOSPITAL_COMMUNITY): Payer: TRICARE For Life (TFL)

## 2023-04-11 ENCOUNTER — Ambulatory Visit (HOSPITAL_COMMUNITY)
Admission: RE | Admit: 2023-04-11 | Discharge: 2023-04-11 | Disposition: A | Discharge status: Home / Self Care | Payer: TRICARE For Life (TFL) | Attending: Neurosurgery | Admitting: Neurosurgery

## 2023-04-11 ENCOUNTER — Other Ambulatory Visit: Payer: Self-pay

## 2023-04-11 ENCOUNTER — Encounter (HOSPITAL_COMMUNITY): Payer: Self-pay | Admitting: Neurosurgery

## 2023-04-11 ENCOUNTER — Other Ambulatory Visit (HOSPITAL_COMMUNITY): Payer: Self-pay

## 2023-04-11 ENCOUNTER — Ambulatory Visit (HOSPITAL_COMMUNITY): Payer: TRICARE For Life (TFL) | Admitting: Vascular Surgery

## 2023-04-11 ENCOUNTER — Ambulatory Visit (HOSPITAL_BASED_OUTPATIENT_CLINIC_OR_DEPARTMENT_OTHER): Payer: TRICARE For Life (TFL) | Admitting: Anesthesiology

## 2023-04-11 ENCOUNTER — Encounter (HOSPITAL_COMMUNITY)
Admission: RE | Disposition: A | Discharge status: Home / Self Care | Payer: Self-pay | Source: Home / Self Care | Attending: Neurosurgery

## 2023-04-11 DIAGNOSIS — K219 Gastro-esophageal reflux disease without esophagitis: Secondary | ICD-10-CM | POA: Diagnosis not present

## 2023-04-11 DIAGNOSIS — I10 Essential (primary) hypertension: Secondary | ICD-10-CM | POA: Diagnosis not present

## 2023-04-11 DIAGNOSIS — F17210 Nicotine dependence, cigarettes, uncomplicated: Secondary | ICD-10-CM | POA: Diagnosis not present

## 2023-04-11 DIAGNOSIS — M5412 Radiculopathy, cervical region: Secondary | ICD-10-CM | POA: Diagnosis present

## 2023-04-11 DIAGNOSIS — M4802 Spinal stenosis, cervical region: Secondary | ICD-10-CM | POA: Diagnosis present

## 2023-04-11 DIAGNOSIS — G4730 Sleep apnea, unspecified: Secondary | ICD-10-CM | POA: Diagnosis not present

## 2023-04-11 DIAGNOSIS — M4712 Other spondylosis with myelopathy, cervical region: Secondary | ICD-10-CM | POA: Insufficient documentation

## 2023-04-11 DIAGNOSIS — M4722 Other spondylosis with radiculopathy, cervical region: Secondary | ICD-10-CM | POA: Insufficient documentation

## 2023-04-11 DIAGNOSIS — M542 Cervicalgia: Secondary | ICD-10-CM

## 2023-04-11 HISTORY — PX: ANTERIOR CERVICAL DECOMP/DISCECTOMY FUSION: SHX1161

## 2023-04-11 LAB — ABO/RH: ABO/RH(D): O POS

## 2023-04-11 SURGERY — ANTERIOR CERVICAL DECOMPRESSION/DISCECTOMY FUSION 1 LEVEL
Anesthesia: General

## 2023-04-11 MED ORDER — SUGAMMADEX SODIUM 200 MG/2ML IV SOLN
INTRAVENOUS | Status: DC | PRN
  Administered 2023-04-11: 300 mg via INTRAVENOUS

## 2023-04-11 MED ORDER — CYCLOBENZAPRINE HCL 10 MG PO TABS: 10.0000 mg | ORAL_TABLET | Freq: Three times a day (TID) | ORAL | Status: DC | PRN

## 2023-04-11 MED ORDER — ROCURONIUM BROMIDE 10 MG/ML (PF) SYRINGE
PREFILLED_SYRINGE | INTRAVENOUS | Status: AC
  Filled 2023-04-11: qty 10

## 2023-04-11 MED ORDER — OXYCODONE-ACETAMINOPHEN 5-325 MG PO TABS: 1.0000 | ORAL_TABLET | ORAL | Status: DC | PRN

## 2023-04-11 MED ORDER — CHLORHEXIDINE GLUCONATE 0.12 % MT SOLN
15.0000 mL | Freq: Once | OROMUCOSAL | Status: AC
  Administered 2023-04-11: 15 mL via OROMUCOSAL
  Filled 2023-04-11: qty 15

## 2023-04-11 MED ORDER — LIDOCAINE 2% (20 MG/ML) 5 ML SYRINGE
INTRAMUSCULAR | Status: DC | PRN
  Administered 2023-04-11: 80 mg via INTRAVENOUS

## 2023-04-11 MED ORDER — ONDANSETRON HCL 4 MG/2ML IJ SOLN
INTRAMUSCULAR | Status: AC
  Filled 2023-04-11: qty 2

## 2023-04-11 MED ORDER — ONDANSETRON HCL 4 MG PO TABS: 4.0000 mg | ORAL_TABLET | Freq: Four times a day (QID) | ORAL | Status: DC | PRN

## 2023-04-11 MED ORDER — SODIUM CHLORIDE 0.9% FLUSH
3.0000 mL | Freq: Two times a day (BID) | INTRAVENOUS | Status: DC
  Administered 2023-04-11: 3 mL via INTRAVENOUS

## 2023-04-11 MED ORDER — HYDROMORPHONE HCL 1 MG/ML IJ SOLN: 0.5000 mg | INTRAMUSCULAR | Status: DC | PRN

## 2023-04-11 MED ORDER — ACETAMINOPHEN 500 MG PO TABS: 1000.0000 mg | ORAL_TABLET | Freq: Once | ORAL | Status: DC | PRN

## 2023-04-11 MED ORDER — OXYCODONE-ACETAMINOPHEN 5-325 MG PO TABS: 1.0000 | ORAL_TABLET | ORAL | 0 refills | Status: DC | PRN

## 2023-04-11 MED ORDER — IBUPROFEN 800 MG PO TABS: 800.0000 mg | ORAL_TABLET | Freq: Three times a day (TID) | ORAL | Status: DC | PRN

## 2023-04-11 MED ORDER — IRBESARTAN 150 MG PO TABS: 300.0000 mg | ORAL_TABLET | Freq: Every day | ORAL | Status: DC

## 2023-04-11 MED ORDER — DEXAMETHASONE SODIUM PHOSPHATE 10 MG/ML IJ SOLN
INTRAMUSCULAR | Status: AC
  Filled 2023-04-11: qty 1

## 2023-04-11 MED ORDER — PROPOFOL 10 MG/ML IV BOLUS
INTRAVENOUS | Status: DC | PRN
  Administered 2023-04-11: 160 mg via INTRAVENOUS

## 2023-04-11 MED ORDER — SODIUM CHLORIDE 0.9% FLUSH: 3.0000 mL | INTRAVENOUS | Status: DC | PRN

## 2023-04-11 MED ORDER — 0.9 % SODIUM CHLORIDE (POUR BTL) OPTIME
TOPICAL | Status: DC | PRN
  Administered 2023-04-11: 1000 mL

## 2023-04-11 MED ORDER — MIDAZOLAM HCL 2 MG/2ML IJ SOLN
INTRAMUSCULAR | Status: DC | PRN
  Administered 2023-04-11: 2 mg via INTRAVENOUS

## 2023-04-11 MED ORDER — ADALIMUMAB 40 MG/0.4ML ~~LOC~~ AJKT
40.0000 mg | AUTO-INJECTOR | SUBCUTANEOUS | Status: DC
Start: 2023-04-11 — End: 2023-04-11

## 2023-04-11 MED ORDER — ALUM & MAG HYDROXIDE-SIMETH 200-200-20 MG/5ML PO SUSP: 30.0000 mL | Freq: Four times a day (QID) | ORAL | Status: DC | PRN

## 2023-04-11 MED ORDER — THROMBIN 5000 UNITS EX SOLR
OROMUCOSAL | Status: DC | PRN
  Administered 2023-04-11: 5 mL via TOPICAL

## 2023-04-11 MED ORDER — OXYCODONE-ACETAMINOPHEN 5-325 MG PO TABS
1.0000 | ORAL_TABLET | ORAL | 0 refills | Status: DC | PRN
  Filled 2023-04-11: qty 30, 5d supply, fill #0

## 2023-04-11 MED ORDER — MENTHOL 3 MG MT LOZG: 1.0000 | LOZENGE | OROMUCOSAL | Status: DC | PRN

## 2023-04-11 MED ORDER — DEXAMETHASONE SODIUM PHOSPHATE 10 MG/ML IJ SOLN
INTRAMUSCULAR | Status: DC | PRN
Start: 2023-04-11 — End: 2023-04-11
  Administered 2023-04-11: 10 mg via INTRAVENOUS

## 2023-04-11 MED ORDER — ACETAMINOPHEN 325 MG PO TABS: 650.0000 mg | ORAL_TABLET | ORAL | Status: DC | PRN

## 2023-04-11 MED ORDER — HYDROCHLOROTHIAZIDE 25 MG PO TABS: 25.0000 mg | ORAL_TABLET | Freq: Every day | ORAL | Status: DC

## 2023-04-11 MED ORDER — SUGAMMADEX SODIUM 200 MG/2ML IV SOLN
INTRAVENOUS | Status: AC
  Filled 2023-04-11: qty 4

## 2023-04-11 MED ORDER — OXYCODONE HCL 5 MG PO TABS: 5.0000 mg | ORAL_TABLET | Freq: Once | ORAL | Status: DC | PRN

## 2023-04-11 MED ORDER — EVOLOCUMAB 140 MG/ML ~~LOC~~ SOAJ
140.0000 mg | SUBCUTANEOUS | Status: DC
Start: 2023-04-11 — End: 2023-04-11

## 2023-04-11 MED ORDER — PROPOFOL 10 MG/ML IV BOLUS
INTRAVENOUS | Status: AC
  Filled 2023-04-11: qty 20

## 2023-04-11 MED ORDER — MIDAZOLAM HCL 2 MG/2ML IJ SOLN
INTRAMUSCULAR | Status: AC
  Filled 2023-04-11: qty 2

## 2023-04-11 MED ORDER — FENTANYL CITRATE (PF) 100 MCG/2ML IJ SOLN
25.0000 ug | INTRAMUSCULAR | Status: DC | PRN
  Administered 2023-04-11 (×2): 50 ug via INTRAVENOUS

## 2023-04-11 MED ORDER — ROCURONIUM BROMIDE 10 MG/ML (PF) SYRINGE
PREFILLED_SYRINGE | INTRAVENOUS | Status: DC | PRN
  Administered 2023-04-11: 20 mg via INTRAVENOUS
  Administered 2023-04-11: 10 mg via INTRAVENOUS
  Administered 2023-04-11: 80 mg via INTRAVENOUS

## 2023-04-11 MED ORDER — B COMPLEX-C PO TABS: 1.0000 | ORAL_TABLET | Freq: Every day | ORAL | Status: DC

## 2023-04-11 MED ORDER — ACETAMINOPHEN 160 MG/5ML PO SOLN: 1000.0000 mg | Freq: Once | ORAL | Status: DC | PRN

## 2023-04-11 MED ORDER — CHLORHEXIDINE GLUCONATE CLOTH 2 % EX PADS: 6.0000 | MEDICATED_PAD | Freq: Once | CUTANEOUS | Status: DC

## 2023-04-11 MED ORDER — OXYCODONE HCL 5 MG/5ML PO SOLN: 5.0000 mg | Freq: Once | ORAL | Status: DC | PRN

## 2023-04-11 MED ORDER — FENTANYL CITRATE (PF) 250 MCG/5ML IJ SOLN
INTRAMUSCULAR | Status: AC
  Filled 2023-04-11: qty 5

## 2023-04-11 MED ORDER — ONDANSETRON HCL 4 MG/2ML IJ SOLN: 4.0000 mg | Freq: Four times a day (QID) | INTRAMUSCULAR | Status: DC | PRN

## 2023-04-11 MED ORDER — PANTOPRAZOLE SODIUM 40 MG IV SOLR: 40.0000 mg | Freq: Every day | INTRAVENOUS | Status: DC

## 2023-04-11 MED ORDER — EPHEDRINE SULFATE-NACL 50-0.9 MG/10ML-% IV SOSY
PREFILLED_SYRINGE | INTRAVENOUS | Status: DC | PRN
Start: 2023-04-11 — End: 2023-04-11

## 2023-04-11 MED ORDER — THROMBIN 5000 UNITS EX KIT
PACK | CUTANEOUS | Status: AC
  Filled 2023-04-11: qty 3

## 2023-04-11 MED ORDER — EPHEDRINE 5 MG/ML INJ
INTRAVENOUS | Status: AC
  Filled 2023-04-11: qty 5

## 2023-04-11 MED ORDER — EZETIMIBE 10 MG PO TABS: 10.0000 mg | ORAL_TABLET | Freq: Every day | ORAL | Status: DC

## 2023-04-11 MED ORDER — CEFAZOLIN SODIUM-DEXTROSE 2-4 GM/100ML-% IV SOLN
2.0000 g | Freq: Three times a day (TID) | INTRAVENOUS | Status: DC
  Administered 2023-04-11: 2 g via INTRAVENOUS
  Filled 2023-04-11 (×2): qty 100

## 2023-04-11 MED ORDER — TELMISARTAN-HCTZ 80-25 MG PO TABS: 1.0000 | ORAL_TABLET | Freq: Every day | ORAL | Status: DC

## 2023-04-11 MED ORDER — LIDOCAINE 2% (20 MG/ML) 5 ML SYRINGE: INTRAMUSCULAR | Status: DC | PRN

## 2023-04-11 MED ORDER — CEFAZOLIN SODIUM-DEXTROSE 3-4 GM/150ML-% IV SOLN
3.0000 g | INTRAVENOUS | Status: AC
  Administered 2023-04-11: 3 g via INTRAVENOUS
  Filled 2023-04-11: qty 150

## 2023-04-11 MED ORDER — FENTANYL CITRATE (PF) 100 MCG/2ML IJ SOLN
INTRAMUSCULAR | Status: AC
  Filled 2023-04-11: qty 2

## 2023-04-11 MED ORDER — THROMBIN (RECOMBINANT) 5000 UNITS EX SOLR
CUTANEOUS | Status: DC | PRN
  Administered 2023-04-11: 10 mL via TOPICAL

## 2023-04-11 MED ORDER — ORAL CARE MOUTH RINSE: 15.0000 mL | Freq: Once | OROMUCOSAL | Status: AC

## 2023-04-11 MED ORDER — SODIUM CHLORIDE 0.9 % IV SOLN
250.0000 mL | INTRAVENOUS | Status: DC
  Administered 2023-04-11: 250 mL via INTRAVENOUS

## 2023-04-11 MED ORDER — ACETAMINOPHEN 10 MG/ML IV SOLN: 1000.0000 mg | Freq: Once | INTRAVENOUS | Status: DC | PRN

## 2023-04-11 MED ORDER — CEPHALEXIN 500 MG PO CAPS: 500.0000 mg | ORAL_CAPSULE | Freq: Three times a day (TID) | ORAL | Status: DC

## 2023-04-11 MED ORDER — LIDOCAINE 2% (20 MG/ML) 5 ML SYRINGE
INTRAMUSCULAR | Status: AC
  Filled 2023-04-11: qty 5

## 2023-04-11 MED ORDER — AMLODIPINE BESYLATE 10 MG PO TABS: 10.0000 mg | ORAL_TABLET | Freq: Every day | ORAL | Status: DC

## 2023-04-11 MED ORDER — FENTANYL CITRATE (PF) 250 MCG/5ML IJ SOLN
INTRAMUSCULAR | Status: DC | PRN
  Administered 2023-04-11: 100 ug via INTRAVENOUS
  Administered 2023-04-11: 150 ug via INTRAVENOUS

## 2023-04-11 MED ORDER — ACETAMINOPHEN 650 MG RE SUPP: 650.0000 mg | RECTAL | Status: DC | PRN

## 2023-04-11 MED ORDER — PHENOL 1.4 % MT LIQD: 1.0000 | OROMUCOSAL | Status: DC | PRN

## 2023-04-11 MED ORDER — ONDANSETRON HCL 4 MG/2ML IJ SOLN
INTRAMUSCULAR | Status: DC | PRN
  Administered 2023-04-11: 4 mg via INTRAVENOUS

## 2023-04-11 MED ORDER — LACTATED RINGERS IV SOLN: INTRAVENOUS | Status: DC

## 2023-04-11 SURGICAL SUPPLY — 54 items
BAG COUNTER SPONGE SURGICOUNT (BAG) ×1 IMPLANT
BAND RUBBER #18 3X1/16 STRL (MISCELLANEOUS) ×2 IMPLANT
BASKET BONE COLLECTION (BASKET) ×1 IMPLANT
BENZOIN TINCTURE PRP APPL 2/3 (GAUZE/BANDAGES/DRESSINGS) ×1 IMPLANT
BIT DRILL NEURO 2X3.1 SFT TUCH (MISCELLANEOUS) ×1 IMPLANT
BONE CC-ACS 11X14X6 6D (Bone Implant) ×1 IMPLANT
BUR MATCHSTICK NEURO 3.0 LAGG (BURR) ×1 IMPLANT
CANISTER SUCT 3000ML PPV (MISCELLANEOUS) ×1 IMPLANT
CHIPS BONE CANC-ACS11X14X6 6D (Bone Implant) IMPLANT
DERMABOND ADVANCED .7 DNX12 (GAUZE/BANDAGES/DRESSINGS) IMPLANT
DRAPE C-ARM 42X72 X-RAY (DRAPES) ×2 IMPLANT
DRAPE LAPAROTOMY 100X72 PEDS (DRAPES) ×1 IMPLANT
DRAPE MICROSCOPE SLANT 54X150 (MISCELLANEOUS) ×1 IMPLANT
DRILL NEURO 2X3.1 SOFT TOUCH (MISCELLANEOUS) ×1
DRSG OPSITE POSTOP 4X6 (GAUZE/BANDAGES/DRESSINGS) IMPLANT
DURAPREP 6ML APPLICATOR 50/CS (WOUND CARE) ×1 IMPLANT
ELECT COATED BLADE 2.86 ST (ELECTRODE) ×1 IMPLANT
ELECT REM PT RETURN 9FT ADLT (ELECTROSURGICAL) ×1
ELECTRODE REM PT RTRN 9FT ADLT (ELECTROSURGICAL) ×1 IMPLANT
GAUZE 4X4 16PLY ~~LOC~~+RFID DBL (SPONGE) IMPLANT
GAUZE SPONGE 4X4 12PLY STRL (GAUZE/BANDAGES/DRESSINGS) ×1 IMPLANT
GLOVE BIO SURGEON STRL SZ7 (GLOVE) IMPLANT
GLOVE BIO SURGEON STRL SZ8 (GLOVE) ×1 IMPLANT
GLOVE BIOGEL PI IND STRL 7.0 (GLOVE) IMPLANT
GLOVE ECLIPSE 6.5 STRL STRAW (GLOVE) IMPLANT
GLOVE EXAM NITRILE XL STR (GLOVE) IMPLANT
GLOVE INDICATOR 8.5 STRL (GLOVE) ×2 IMPLANT
GOWN STRL REUS W/ TWL LRG LVL3 (GOWN DISPOSABLE) ×1 IMPLANT
GOWN STRL REUS W/ TWL XL LVL3 (GOWN DISPOSABLE) ×1 IMPLANT
GOWN STRL REUS W/TWL 2XL LVL3 (GOWN DISPOSABLE) ×1 IMPLANT
HALTER HD/CHIN CERV TRACTION D (MISCELLANEOUS) ×1 IMPLANT
HEMOSTAT POWDER KIT SURGIFOAM (HEMOSTASIS) ×1 IMPLANT
KIT BASIN OR (CUSTOM PROCEDURE TRAY) ×1 IMPLANT
KIT TURNOVER KIT B (KITS) ×1 IMPLANT
NDL HYPO 18GX1.5 BLUNT FILL (NEEDLE) ×1 IMPLANT
NDL SPNL 20GX3.5 QUINCKE YW (NEEDLE) ×1 IMPLANT
NEEDLE HYPO 18GX1.5 BLUNT FILL (NEEDLE) ×1 IMPLANT
NEEDLE SPNL 20GX3.5 QUINCKE YW (NEEDLE) ×1 IMPLANT
NS IRRIG 1000ML POUR BTL (IV SOLUTION) ×1 IMPLANT
PACK LAMINECTOMY NEURO (CUSTOM PROCEDURE TRAY) ×1 IMPLANT
PAD ARMBOARD 7.5X6 YLW CONV (MISCELLANEOUS) ×3 IMPLANT
PIN DISTRACTION 14MM (PIN) IMPLANT
PLATE CERV RES ACP 14 1L (Plate) IMPLANT
SCREW VA SD 4.2X16 (Screw) IMPLANT
SCREW VA SD 4.6X12 (Screw) IMPLANT
SPONGE INTESTINAL PEANUT (DISPOSABLE) ×1 IMPLANT
SPONGE SURGIFOAM ABS GEL SZ50 (HEMOSTASIS) ×1 IMPLANT
STRIP CLOSURE SKIN 1/2X4 (GAUZE/BANDAGES/DRESSINGS) ×1 IMPLANT
SUT VIC AB 3-0 SH 8-18 (SUTURE) ×1 IMPLANT
SUT VICRYL 4-0 PS2 18IN ABS (SUTURE) ×1 IMPLANT
TAPE CLOTH 4X10 WHT NS (GAUZE/BANDAGES/DRESSINGS) ×1 IMPLANT
TOWEL GREEN STERILE (TOWEL DISPOSABLE) ×1 IMPLANT
TOWEL GREEN STERILE FF (TOWEL DISPOSABLE) ×1 IMPLANT
WATER STERILE IRR 1000ML POUR (IV SOLUTION) ×1 IMPLANT

## 2023-04-11 NOTE — Plan of Care (Signed)

## 2023-04-11 NOTE — Discharge Summary (Signed)
Physician Discharge Summary  Patient ID: Terrance Snyder MRN: [id number] DOB/AGE: [birth year] 68 y.o.  Admit date: 04/11/2023 Discharge date: 04/11/2023  Admission Diagnoses: cervical stenosis    Discharge Diagnoses: sam3   Discharged Condition: good  Hospital Course: The patient was admitted on 04/11/2023 and taken to the operating room where the patient underwent ACDF. The patient tolerated the procedure well and was taken to the recovery room and then to the floor in stable condition. The hospital course was routine. There were no complications. The wound remained clean dry and intact. Pt had appropriate neck soreness. No complaints of arm pain or new N/T/W. The patient remained afebrile with stable vital signs, and tolerated a regular diet. The patient continued to increase activities, and pain was well controlled with oral pain medications.   Consults: None  Significant Diagnostic Studies:  Results for orders placed or performed during the hospital encounter of 04/11/23  ABO/Rh   Collection Time: 04/11/23  8:05 AM  Result Value Ref Range   ABO/RH(D)      O POS Performed at [facility], [address] [place], [place] [address]     DG Cervical Spine 2 or 3 views Result Date: 04/11/2023 CLINICAL DATA:  Elective surgery. EXAM: CERVICAL SPINE - 2-3 VIEW COMPARISON:  Preoperative imaging FINDINGS: Three fluoroscopic spot views of the cervical spine submitted from the operating room. There is previous C3 through C5 fusion. Subsequent anterior fusion C6-C7. Fluoroscopy time 13.2 seconds. Dose 4.86 mGy. IMPRESSION: Procedural fluoroscopy during cervical spine surgery. Electronically Signed   By: Narda Rutherford M.D.   On: 04/11/2023 13:22   DG C-Arm 1-60 Min-No Report Result Date: 04/11/2023 Fluoroscopy was utilized by the requesting physician.  No radiographic interpretation.   DG C-Arm 1-60 Min-No Report Result Date: 04/11/2023 Fluoroscopy was utilized by the requesting  physician.  No radiographic interpretation.    Antibiotics:  Anti-infectives (From admission, onward)    Start     Dose/Rate Route Frequency Ordered Stop   04/12/23 1000  cephALEXin (KEFLEX) capsule 500 mg        500 mg Oral 3 times daily 04/11/23 1418     04/11/23 1430  ceFAZolin (ANCEF) IVPB 2g/100 mL premix        2 g 200 mL/hr over 30 Minutes Intravenous Every 8 hours 04/11/23 1418 04/12/23 0629   04/11/23 0730  ceFAZolin (ANCEF) IVPB 3g/150 mL premix        3 g 300 mL/hr over 30 Minutes Intravenous On call to O.R. 04/11/23 0723 04/11/23 1029       Discharge Exam: Blood pressure (!) 150/77, pulse 68, temperature 97.8 F (36.6 C), resp. rate 20, height 6\' 3"  (1.905 m), weight 123.8 kg, SpO2 100%. Neurologic: Grossly normal Dressing dry  Discharge Medications:   Allergies as of 04/11/2023       Reactions   Shellfish Allergy Nausea And Vomiting        Medication List     STOP taking these medications    cephALEXin 500 MG capsule Commonly known as: KEFLEX   omeprazole 20 MG capsule Commonly known as: PriLOSEC       TAKE these medications    amLODipine 10 MG tablet Commonly known as: NORVASC TAKE 1 TABLET(10 MG) BY MOUTH DAILY   b complex vitamins capsule Take 1 capsule by mouth daily.   ezetimibe 10 MG tablet Commonly known as: ZETIA Take 1 tablet (10 mg total) by mouth daily.   Humira (2 Pen) 40 MG/0.4ML pen Generic  drug: adalimumab Inject 40 mg into the skin every 14 (fourteen) days.   ibuprofen 800 MG tablet Commonly known as: ADVIL Take 800 mg by mouth every 8 (eight) hours as needed for moderate pain (pain score 4-6).   Micardis HCT 80-25 MG tablet Generic drug: telmisartan-hydrochlorothiazide Take 1 tablet by mouth daily.   oxyCODONE-acetaminophen 5-325 MG tablet Commonly known as: Roxicet Take 1 tablet by mouth every 4 (four) hours as needed for severe pain (pain score 7-10).   Repatha SureClick 140 MG/ML Soaj Generic drug:  Evolocumab Inject 140 mg into the skin every 14 (fourteen) days.        Disposition: home   Final Dx: acdf  Discharge Instructions      Remove dressing in 72 hours   Complete by: As directed    Call MD for:  difficulty breathing, headache or visual disturbances   Complete by: As directed    Call MD for:  difficulty breathing, headache or visual disturbances   Complete by: As directed    Call MD for:  persistant nausea and vomiting   Complete by: As directed    Call MD for:  persistant nausea and vomiting   Complete by: As directed    Call MD for:  redness, tenderness, or signs of infection (pain, swelling, redness, odor or green/yellow discharge around incision site)   Complete by: As directed    Call MD for:  redness, tenderness, or signs of infection (pain, swelling, redness, odor or green/yellow discharge around incision site)   Complete by: As directed    Call MD for:  severe uncontrolled pain   Complete by: As directed    Call MD for:  severe uncontrolled pain   Complete by: As directed    Call MD for:  temperature >100.4   Complete by: As directed    Call MD for:  temperature >100.4   Complete by: As directed    Diet - low sodium heart healthy   Complete by: As directed    Increase activity slowly   Complete by: As directed    Increase activity slowly   Complete by: As directed           Signed: Tia Alert 04/11/2023, 3:58 PM

## 2023-04-11 NOTE — Transfer of Care (Signed)
Immediate Anesthesia Transfer of Care Note  Patient: Terrance Snyder  Procedure(s) Performed: Anterior Cervical Decompression Fusion  - Cervical six-Cervical seven  Patient Location: PACU  Anesthesia Type:General  Level of Consciousness: awake, oriented, and patient cooperative  Airway & Oxygen Therapy: Patient Spontanous Breathing and Patient connected to face mask oxygen  Post-op Assessment: Report given to RN and Post -op Vital signs reviewed and stable  Post vital signs: Reviewed and stable  Last Vitals:  Vitals Value Taken Time  BP 133/64 04/11/23 1237  Temp    Pulse 80 04/11/23 1240  Resp 15 04/11/23 1240  SpO2 92 % 04/11/23 1240  Vitals shown include unfiled device data.  Last Pain:  Vitals:   04/11/23 0746  PainSc: 8       Patients Stated Pain Goal: 2 (04/11/23 0746)  Complications: No notable events documented.

## 2023-04-11 NOTE — Progress Notes (Signed)
Patient alert and oriented, mae's well, voiding adequate amount of urine, swallowing without difficulty, no c/o pain at time of discharge. Patient discharged home with family. Script and discharged instructions given to patient. Patient and family stated understanding of instructions given. Patient has an appointment with Dr. Cram in 2 weeks 

## 2023-04-11 NOTE — Plan of Care (Signed)
Problem: Education: Goal: Knowledge of General Education information will improve Description: Including pain rating scale, medication(s)/side effects and non-pharmacologic comfort measures 04/11/2023 1735 by Vincente Liberty, RN Outcome: Completed/Met 04/11/2023 1503 by Vincente Liberty, RN Outcome: Progressing   Problem: Health Behavior/Discharge Planning: Goal: Ability to manage health-related needs will improve 04/11/2023 1735 by Vincente Liberty, RN Outcome: Completed/Met 04/11/2023 1503 by Vincente Liberty, RN Outcome: Progressing   Problem: Clinical Measurements: Goal: Ability to maintain clinical measurements within normal limits will improve 04/11/2023 1735 by Vincente Liberty, RN Outcome: Completed/Met 04/11/2023 1503 by Vincente Liberty, RN Outcome: Progressing Goal: Will remain free from infection 04/11/2023 1735 by Vincente Liberty, RN Outcome: Completed/Met 04/11/2023 1503 by Vincente Liberty, RN Outcome: Progressing Goal: Diagnostic test results will improve 04/11/2023 1735 by Vincente Liberty, RN Outcome: Completed/Met 04/11/2023 1503 by Vincente Liberty, RN Outcome: Progressing Goal: Respiratory complications will improve 04/11/2023 1735 by Vincente Liberty, RN Outcome: Completed/Met 04/11/2023 1503 by Vincente Liberty, RN Outcome: Progressing Goal: Cardiovascular complication will be avoided 04/11/2023 1735 by Vincente Liberty, RN Outcome: Completed/Met 04/11/2023 1503 by Vincente Liberty, RN Outcome: Progressing   Problem: Activity: Goal: Risk for activity intolerance will decrease 04/11/2023 1735 by Vincente Liberty, RN Outcome: Completed/Met 04/11/2023 1503 by Vincente Liberty, RN Outcome: Progressing   Problem: Nutrition: Goal: Adequate nutrition will be maintained 04/11/2023 1735 by Vincente Liberty, RN Outcome: Completed/Met 04/11/2023 1503 by Vincente Liberty, RN Outcome: Progressing   Problem: Coping: Goal: Level of anxiety will decrease 04/11/2023  1735 by Vincente Liberty, RN Outcome: Completed/Met 04/11/2023 1503 by Vincente Liberty, RN Outcome: Progressing   Problem: Elimination: Goal: Will not experience complications related to bowel motility 04/11/2023 1735 by Vincente Liberty, RN Outcome: Completed/Met 04/11/2023 1503 by Vincente Liberty, RN Outcome: Progressing Goal: Will not experience complications related to urinary retention 04/11/2023 1735 by Vincente Liberty, RN Outcome: Completed/Met 04/11/2023 1503 by Vincente Liberty, RN Outcome: Progressing   Problem: Pain Managment: Goal: General experience of comfort will improve and/or be controlled 04/11/2023 1735 by Vincente Liberty, RN Outcome: Completed/Met 04/11/2023 1503 by Vincente Liberty, RN Outcome: Progressing   Problem: Safety: Goal: Ability to remain free from injury will improve 04/11/2023 1735 by Vincente Liberty, RN Outcome: Completed/Met 04/11/2023 1503 by Vincente Liberty, RN Outcome: Progressing   Problem: Skin Integrity: Goal: Risk for impaired skin integrity will decrease 04/11/2023 1735 by Vincente Liberty, RN Outcome: Completed/Met 04/11/2023 1503 by Vincente Liberty, RN Outcome: Progressing   Problem: Education: Goal: Ability to verbalize activity precautions or restrictions will improve 04/11/2023 1735 by Vincente Liberty, RN Outcome: Completed/Met 04/11/2023 1503 by Vincente Liberty, RN Outcome: Progressing Goal: Knowledge of the prescribed therapeutic regimen will improve 04/11/2023 1735 by Vincente Liberty, RN Outcome: Completed/Met 04/11/2023 1503 by Vincente Liberty, RN Outcome: Progressing Goal: Understanding of discharge needs will improve 04/11/2023 1735 by Vincente Liberty, RN Outcome: Completed/Met 04/11/2023 1503 by Vincente Liberty, RN Outcome: Progressing   Problem: Activity: Goal: Ability to avoid complications of mobility impairment will improve 04/11/2023 1735 by Vincente Liberty, RN Outcome: Completed/Met 04/11/2023 1503 by  Vincente Liberty, RN Outcome: Progressing Goal: Ability to tolerate increased activity will improve 04/11/2023 1735 by Vincente Liberty, RN Outcome: Completed/Met 04/11/2023 1503 by Vincente Liberty, RN Outcome: Progressing Goal: Will remain free from falls 04/11/2023 1735 by Vincente Liberty, RN Outcome: Completed/Met 04/11/2023 1503 by Vincente Liberty, RN Outcome: Progressing   Problem: Bowel/Gastric: Goal: Gastrointestinal status for postoperative course will improve 04/11/2023 1735 by Vincente Liberty, RN Outcome: Completed/Met 04/11/2023 1503 by Vincente Liberty, RN Outcome: Progressing   Problem: Clinical Measurements: Goal: Ability to  maintain clinical measurements within normal limits will improve 04/11/2023 1735 by Vincente Liberty, RN Outcome: Completed/Met 04/11/2023 1503 by Vincente Liberty, RN Outcome: Progressing Goal: Postoperative complications will be avoided or minimized 04/11/2023 1735 by Vincente Liberty, RN Outcome: Completed/Met 04/11/2023 1503 by Vincente Liberty, RN Outcome: Progressing Goal: Diagnostic test results will improve 04/11/2023 1735 by Vincente Liberty, RN Outcome: Completed/Met 04/11/2023 1503 by Vincente Liberty, RN Outcome: Progressing   Problem: Pain Management: Goal: Pain level will decrease 04/11/2023 1735 by Vincente Liberty, RN Outcome: Completed/Met 04/11/2023 1503 by Vincente Liberty, RN Outcome: Progressing   Problem: Skin Integrity: Goal: Will show signs of wound healing 04/11/2023 1735 by Vincente Liberty, RN Outcome: Completed/Met 04/11/2023 1503 by Vincente Liberty, RN Outcome: Progressing   Problem: Health Behavior/Discharge Planning: Goal: Identification of resources available to assist in meeting health care needs will improve 04/11/2023 1735 by Vincente Liberty, RN Outcome: Completed/Met 04/11/2023 1503 by Vincente Liberty, RN Outcome: Progressing   Problem: Bladder/Genitourinary: Goal: Urinary functional status for  postoperative course will improve 04/11/2023 1735 by Vincente Liberty, RN Outcome: Completed/Met 04/11/2023 1503 by Vincente Liberty, RN Outcome: Progressing

## 2023-04-11 NOTE — Discharge Summary (Signed)
Physician Discharge Summary  Patient ID: Terrance Snyder MRN: [id number] DOB/AGE: 68-Jun-1957 68 y.o.  Admit date: 04/11/2023 Discharge date: 04/11/2023  Admission Diagnoses: cervical stenosis    Discharge Diagnoses: same   Discharged Condition: good  Hospital Course: The patient was admitted on 04/11/2023 and taken to the operating room where the patient underwent ACDF. The patient tolerated the procedure well and was taken to the recovery room and then to the floor in stable condition. The hospital course was routine. There were no complications. The wound remained clean dry and intact. Pt had appropriate neck soreness. No complaints of arm pain or new N/T/W. The patient remained afebrile with stable vital signs, and tolerated a regular diet. The patient continued to increase activities, and pain was well controlled with oral pain medications.   Consults: None  Significant Diagnostic Studies:  Results for orders placed or performed during the hospital encounter of 04/11/23  ABO/Rh   Collection Time: 04/11/23  8:05 AM  Result Value Ref Range   ABO/RH(D)      O POS Performed at [facility], [address] [place], [place] [address]     DG Cervical Spine 2 or 3 views Result Date: 04/11/2023 CLINICAL DATA:  Elective surgery. EXAM: CERVICAL SPINE - 2-3 VIEW COMPARISON:  Preoperative imaging FINDINGS: Three fluoroscopic spot views of the cervical spine submitted from the operating room. There is previous C3 through C5 fusion. Subsequent anterior fusion C6-C7. Fluoroscopy time 13.2 seconds. Dose 4.86 mGy. IMPRESSION: Procedural fluoroscopy during cervical spine surgery. Electronically Signed   By: Narda Rutherford M.D.   On: 04/11/2023 13:22   DG C-Arm 1-60 Min-No Report Result Date: 04/11/2023 Fluoroscopy was utilized by the requesting physician.  No radiographic interpretation.   DG C-Arm 1-60 Min-No Report Result Date: 04/11/2023 Fluoroscopy was utilized by the requesting  physician.  No radiographic interpretation.    Antibiotics:  Anti-infectives (From admission, onward)    Start     Dose/Rate Route Frequency Ordered Stop   04/12/23 1000  cephALEXin (KEFLEX) capsule 500 mg        500 mg Oral 3 times daily 04/11/23 1418     04/11/23 1430  ceFAZolin (ANCEF) IVPB 2g/100 mL premix        2 g 200 mL/hr over 30 Minutes Intravenous Every 8 hours 04/11/23 1418 04/12/23 0629   04/11/23 0730  ceFAZolin (ANCEF) IVPB 3g/150 mL premix        3 g 300 mL/hr over 30 Minutes Intravenous On call to O.R. 04/11/23 0723 04/11/23 1029       Discharge Exam: Blood pressure (!) 150/77, pulse 68, temperature 97.8 F (36.6 C), resp. rate 20, height 6\' 3"  (1.905 m), weight 123.8 kg, SpO2 100%. Neurologic: Grossly normal Dressing dry  Discharge Medications:   Allergies as of 04/11/2023       Reactions   Shellfish Allergy Nausea And Vomiting        Medication List     STOP taking these medications    cephALEXin 500 MG capsule Commonly known as: KEFLEX   omeprazole 20 MG capsule Commonly known as: PriLOSEC       TAKE these medications    amLODipine 10 MG tablet Commonly known as: NORVASC TAKE 1 TABLET(10 MG) BY MOUTH DAILY   b complex vitamins capsule Take 1 capsule by mouth daily.   ezetimibe 10 MG tablet Commonly known as: ZETIA Take 1 tablet (10 mg total) by mouth daily.   Humira (2 Pen) 40 MG/0.4ML pen Generic  drug: adalimumab Inject 40 mg into the skin every 14 (fourteen) days.   ibuprofen 800 MG tablet Commonly known as: ADVIL Take 800 mg by mouth every 8 (eight) hours as needed for moderate pain (pain score 4-6).   Micardis HCT 80-25 MG tablet Generic drug: telmisartan-hydrochlorothiazide Take 1 tablet by mouth daily.   oxyCODONE-acetaminophen 5-325 MG tablet Commonly known as: Roxicet Take 1 tablet by mouth every 4 (four) hours as needed for severe pain (pain score 7-10).   Repatha SureClick 140 MG/ML Soaj Generic drug:  Evolocumab Inject 140 mg into the skin every 14 (fourteen) days.        Disposition: home   Final Dx: ACDF  Discharge Instructions      Remove dressing in 72 hours   Complete by: As directed    Call MD for:  difficulty breathing, headache or visual disturbances   Complete by: As directed    Call MD for:  difficulty breathing, headache or visual disturbances   Complete by: As directed    Call MD for:  persistant nausea and vomiting   Complete by: As directed    Call MD for:  persistant nausea and vomiting   Complete by: As directed    Call MD for:  redness, tenderness, or signs of infection (pain, swelling, redness, odor or green/yellow discharge around incision site)   Complete by: As directed    Call MD for:  redness, tenderness, or signs of infection (pain, swelling, redness, odor or green/yellow discharge around incision site)   Complete by: As directed    Call MD for:  severe uncontrolled pain   Complete by: As directed    Call MD for:  severe uncontrolled pain   Complete by: As directed    Call MD for:  temperature >100.4   Complete by: As directed    Call MD for:  temperature >100.4   Complete by: As directed    Diet - low sodium heart healthy   Complete by: As directed    Increase activity slowly   Complete by: As directed    Increase activity slowly   Complete by: As directed           Signed: Tia Alert 04/11/2023, 3:58 PM

## 2023-04-11 NOTE — Evaluation (Signed)
Occupational Therapy Evaluation Patient Details Name: Terrance Snyder MRN: [id number] DOB: 01/01/1956 Today's Date: 04/11/2023   History of Present Illness   Pt is a 68 y.o. male s/p C6-7 ACDF. PMH significant for cervical surgery 2017, bil hip replacement 2017, HLD, HTN, gout, sleep apnea.     Clinical Impressions PTA, pt lived with wife who assisted with ADL and IADL. Upon eval, pt requiring set-up A for UB ADL, and up to mod A for LB ADL. Pt educated and demonstrating compensatory techniques for bed mobility, UB ADL, LB ADL, brace application, toileting, shower transfers, and grooming within precautions. All education provided and questions answered. Recommending discharge home with HHOT.       If plan is discharge home, recommend the following:   A little help with walking and/or transfers;A lot of help with bathing/dressing/bathroom;Assistance with cooking/housework;Assist for transportation;Help with stairs or ramp for entrance     Functional Status Assessment   Patient has had a recent decline in their functional status and demonstrates the ability to make significant improvements in function in a reasonable and predictable amount of time.     Equipment Recommendations   BSC/3in1     Recommendations for Other Services         Precautions/Restrictions   Precautions Precautions: Cervical Precaution Booklet Issued: Yes (comment) Recall of Precautions/Restrictions: Intact Precaution/Restrictions Comments: all precautions reviewed within the context of ADL Required Braces or Orthoses: Cervical Brace Cervical Brace: Soft collar;For comfort Restrictions Weight Bearing Restrictions Per Provider Order: No     Mobility Bed Mobility Overal bed mobility: Needs Assistance Bed Mobility: Rolling, Sidelying to Sit Rolling: Supervision Sidelying to sit: Supervision            Transfers Overall transfer level: Needs assistance Equipment used: Rolling walker  (2 wheels) Transfers: Sit to/from Stand Sit to Stand: Min assist           General transfer comment: from slightly elevated surface      Balance Overall balance assessment: Needs assistance Sitting-balance support: No upper extremity supported, Feet supported Sitting balance-Leahy Scale: Good     Standing balance support: Bilateral upper extremity supported, During functional activity Standing balance-Leahy Scale: Poor Standing balance comment: reliant on RW                           ADL either performed or assessed with clinical judgement   ADL Overall ADL's : Needs assistance/impaired Eating/Feeding: Set up;Sitting   Grooming: Set up;Sitting   Upper Body Bathing: Set up;Sitting   Lower Body Bathing: Minimal assistance;Sit to/from stand   Upper Body Dressing : Set up;Sitting Upper Body Dressing Details (indicate cue type and reason): increasd time Lower Body Dressing: Minimal assistance;Sit to/from stand;Moderate assistance Lower Body Dressing Details (indicate cue type and reason): mod A for socks, min A for pants/underpants Toilet Transfer: Minimal assistance;Ambulation;Rolling walker (2 wheels) Toilet Transfer Details (indicate cue type and reason): min A progressing to CGA Toileting- Clothing Manipulation and Hygiene: Contact guard assist Toileting - Clothing Manipulation Details (indicate cue type and reason): standing to urinate in toilet     Functional mobility during ADLs: Minimal assistance;Contact guard assist;Rolling walker (2 wheels) General ADL Comments: min A for ambulation progressing to CGA     Vision Patient Visual Report: No change from baseline       Perception         Praxis         Pertinent Vitals/Pain Pain Assessment Pain Assessment: Faces  Faces Pain Scale: Hurts even more Pain Location: pt reports different places, R knee, R arm, neck Pain Descriptors / Indicators: Aching, Sore Pain Intervention(s): Monitored during  session     Extremity/Trunk Assessment Upper Extremity Assessment Upper Extremity Assessment: Generalized weakness;RUE deficits/detail (decreased dexterity bilaterally. ROM functional for BADL) RUE Deficits / Details: continued pain.   Lower Extremity Assessment Lower Extremity Assessment: Defer to PT evaluation   Cervical / Trunk Assessment Cervical / Trunk Assessment: Neck Surgery   Communication Communication Communication: No apparent difficulties   Cognition Arousal: Alert Behavior During Therapy: WFL for tasks assessed/performed Cognition: No apparent impairments                               Following commands: Intact       Cueing  General Comments   Cueing Techniques: Verbal cues      Exercises     Shoulder Instructions      Home Living Family/patient expects to be discharged to:: Private residence Living Arrangements: Spouse/significant other Available Help at Discharge: Family Type of Home: House Home Access: Stairs to enter [employer] of Steps: 2 in garage but has ramp there right now   Home Layout: Able to live on main level with bedroom/bathroom     Bathroom Shower/Tub: Producer, television/film/video: Handicapped height Bathroom Accessibility: Yes How Accessible: Accessible via walker Home Equipment: Rolling Walker (2 wheels);Cane - single point;Shower seat;Wheelchair - IT trainer          Prior Functioning/Environment Prior Level of Function : Needs assist             Mobility Comments: use of RW in home; recent use of wheelchair on ramp to get in and out of house ADLs Comments: wife or son A with transfers as needed    OT Problem List: Decreased strength;Decreased activity tolerance;Impaired balance (sitting and/or standing);Decreased knowledge of use of DME or AE;Decreased knowledge of precautions;Pain;Impaired UE functional use   OT Treatment/Interventions: Self-care/ADL  training;Therapeutic exercise;DME and/or AE instruction;Balance training;Patient/family education;Therapeutic activities      OT Goals(Current goals can be found in the care plan section)   Acute Rehab OT Goals Patient Stated Goal: go home OT Goal Formulation: With patient Time For Goal Achievement: 04/25/23 Potential to Achieve Goals: Good   OT Frequency:  Min 1X/week    Co-evaluation              AM-PAC OT "6 Clicks" Daily Activity     Outcome Measure Help from another person eating meals?: None Help from another person taking care of personal grooming?: A Little Help from another person toileting, which includes using toliet, bedpan, or urinal?: A Little Help from another person bathing (including washing, rinsing, drying)?: A Little Help from another person to put on and taking off regular upper body clothing?: A Little Help from another person to put on and taking off regular lower body clothing?: A Little 6 Click Score: 19   End of Session Equipment Utilized During Treatment: Gait belt;Rolling walker (2 wheels);Cervical collar Nurse Communication: Mobility status  Activity Tolerance: Patient tolerated treatment well Patient left: in bed;with call bell/phone within reach  OT Visit Diagnosis: Unsteadiness on feet (R26.81);Muscle weakness (generalized) (M62.81);Pain Pain - Right/Left: Right Pain - part of body: Knee                Time: 9147-8295 OT Time Calculation (min): 30 min Charges:  OT General Charges $  OT Visit: 1 Visit OT Evaluation $OT Eval Low Complexity: 1 Low OT Treatments $Self Care/Home Management : 8-22 mins  Tyler Deis, OTR/L [facility] Acute Rehabilitation Office: [phone]   Myrla Halsted 04/11/2023, 4:56 PM

## 2023-04-11 NOTE — H&P (Signed)
Terrance Snyder is an 68 y.o. male.   Chief Complaint: Neck pain right arm pain HPI: 68 year old gentleman with progressive worsening neck pain right arm pain rating down the C7 distribution workup revealed cervical spondylosis with stenosis cord compression at C6-7.  Due to patient's progression of clinical syndrome imaging findings of a conservative treatment I recommended an ACDF at C6-7.  I extensively reviewed the risks and benefits of the operation with the patient as well as perioperative course expectations of outcome and alternatives of surgery and he understood and agreed to proceed forward.  Past Medical History:  Diagnosis Date   Complication of anesthesia    2017unable to intubating d/t a mass which has since been removed.   Diverticulosis    ED (erectile dysfunction)    GERD (gastroesophageal reflux disease)    Gout    Hyperlipidemia    Hypertension    Impaired fasting glucose    Osteoarthritis    Sleep apnea    wears CPAP   Vallecular cyst     Past Surgical History:  Procedure Laterality Date   ACHILLES TENDON SURGERY     Right   CERVICAL DISC SURGERY  12/2015   by Dr Wynetta Emery at [facility]   DIRECT LARYNGOSCOPY N/A 01/17/2016   Procedure: DIRECT LARYNGOSCOPY;  Surgeon: Newman Pies, MD;  Location: [facility];  Service: ENT;  Laterality: N/A;   JOINT REPLACEMENT Bilateral    hip replacement   MASS EXCISION N/A 01/17/2016   Procedure: EXCISION MASS;  Surgeon: Newman Pies, MD;  Location:  SURGERY CENTER;  Service: ENT;  Laterality: N/A;   TOTAL HIP ARTHROPLASTY Right 2005    Family History  Problem Relation Age of Onset   CVA Mother    Rheum arthritis Mother    Hyperlipidemia Mother    Hyperlipidemia Father    Cancer Father        Colon Cancer   Social History:  reports that he has been smoking cigarettes. He has never used smokeless tobacco. He reports current alcohol use. He reports that he does not use drugs.  Allergies:  Allergies  Allergen  Reactions   Shellfish Allergy Nausea And Vomiting    Medications Prior to Admission  Medication Sig Dispense Refill   amLODipine (NORVASC) 10 MG tablet TAKE 1 TABLET(10 MG) BY MOUTH DAILY     b complex vitamins capsule Take 1 capsule by mouth daily.     ezetimibe (ZETIA) 10 MG tablet Take 1 tablet (10 mg total) by mouth daily. 90 tablet 3   HUMIRA PEN 40 MG/0.4ML PNKT Inject 40 mg into the skin every 14 (fourteen) days.     ibuprofen (ADVIL) 800 MG tablet Take 800 mg by mouth every 8 (eight) hours as needed for moderate pain (pain score 4-6).     MICARDIS HCT 80-25 MG tablet Take 1 tablet by mouth daily.     REPATHA SURECLICK 140 MG/ML SOAJ Inject 140 mg into the skin every 14 (fourteen) days.     cephALEXin (KEFLEX) 500 MG capsule Take 1 capsule (500 mg total) by mouth 3 (three) times daily. (Patient not taking: Reported on 04/03/2023) 21 capsule 0   omeprazole (PRILOSEC) 20 MG capsule Take 1 capsule (20 mg total) by mouth 2 (two) times daily before a meal. (Patient not taking: Reported on 04/03/2023) 180 capsule 3   oxyCODONE-acetaminophen (ROXICET) 5-325 MG tablet Take 1 tablet by mouth every 4 (four) hours as needed for severe pain. (Patient not taking: Reported on 04/03/2023) 15  tablet 0    Results for orders placed or performed during the hospital encounter of 04/11/23 (from the past 48 hours)  ABO/Rh     Status: None   Collection Time: 04/11/23  8:05 AM  Result Value Ref Range   ABO/RH(D)      O POS Performed at [facility], [address], [place], [place] [address]    No results found.  Review of Systems  Musculoskeletal:  Positive for neck pain.    Blood pressure (!) 142/76, pulse 72, temperature 98.1 F (36.7 C), resp. rate 18, height 6\' 3"  (1.905 m), weight 123.8 kg, SpO2 99%. Physical Exam HENT:     Head: Normocephalic.     Right Ear: Tympanic membrane normal.     Nose: Nose normal.     Mouth/Throat:     Mouth: Mucous membranes are moist.  Cardiovascular:      Rate and Rhythm: Normal rate.     Pulses: Normal pulses.  Pulmonary:     Effort: Pulmonary effort is normal.  Musculoskeletal:     Cervical back: Normal range of motion.  Neurological:     Mental Status: He is alert.     Comments: Strength is 5 out of 5 deltoid, bicep, tricep, wrist flexion, wrist extension, hand intrinsics      Assessment/Plan 68 year old presents for ACDF C6-7  [name], MD 04/11/2023, 9:40 AM

## 2023-04-11 NOTE — TOC Transition Note (Signed)
Transition of Care [facility]) - Discharge Note   Patient Details  Name: Terrance Snyder MRN: [id number] Date of Birth: [birth year]  Transition of [facility] CM/SW Contact:  Kermit Balo, RN Phone Number: 04/11/2023, 4:58 PM   Clinical Narrative:     Pt is discharging home with home health services through [facility]. Information on the AVS. [facility] will contact him for the first home visit. Pt has transportation home.  Final next level of care: Home w Home Health Services Barriers to Discharge: No Barriers Identified   Patient Goals and CMS Choice   CMS Medicare.gov Compare Post Acute Care list provided to:: Patient Choice offered to / list presented to : Patient      Discharge Placement                       Discharge Plan and Services Additional resources added to the After Visit Summary for                            [facility] Arranged: PT, OT [facility] Agency: [facility] Date [facility] Agency Contacted: 04/11/23   Representative spoke with at [facility] Agency: Clifton Custard  Social Drivers of Health (SDOH) Interventions SDOH Screenings   Food Insecurity: No Food Insecurity (01/23/2023)   Received from [facility]  Housing: Low Risk  (01/23/2023)   Received from [facility]  Transportation Needs: No Transportation Needs (01/23/2023)   Received from [facility]  Utilities: Not At Risk (01/23/2023)   Received from [facility]  Financial Resource Strain: Medium Risk (01/23/2023)   Received from [facility]  Social Connections: Unknown (07/03/2021)   Received from [facility]  Tobacco Use: High Risk (04/11/2023)     Readmission Risk Interventions     No data to display

## 2023-04-11 NOTE — Anesthesia Procedure Notes (Signed)
Procedure Name: Intubation Date/Time: 04/11/2023 10:13 AM  Performed by: Orlin Hilding, CRNAPre-anesthesia Checklist: Patient identified, Emergency Drugs available, Suction available, Patient being monitored and Timeout performed Patient Re-evaluated:Patient Re-evaluated prior to induction Oxygen Delivery Method: Circle system utilized Preoxygenation: Pre-oxygenation with 100% oxygen Induction Type: IV induction Ventilation: Mask ventilation without difficulty Laryngoscope Size: Glidescope and 4 Grade View: Grade I Tube type: Oral Tube size: 7.0 mm Number of attempts: 1 Placement Confirmation: ETT inserted through vocal cords under direct vision, positive ETCO2 and breath sounds checked- equal and bilateral Secured at: 25 cm Tube secured with: Tape Dental Injury: Teeth and Oropharynx as per pre-operative assessment

## 2023-04-11 NOTE — Op Note (Signed)
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  Postoperative diagnosis: Same.  Procedure: Anterior cervical discectomy fusion at C6-7 utilizing allograft and globus resonate plating system.  Surgeon: Donalee Citrin.  Assistant: Coletta Memos.  Anesthesia: General.  EBL: Minimal.  HPI: 68 year old gentleman progressive worsening neck bilateral shoulder and arm pain workup revealed severe cervical spondylosis cord compression at C6-7.  Due to the patient's progression of clinical syndrome imaging findings and failed conservative treatment I recommended anterior cervical discectomy and fusion at that level.  I extensively reviewed the risks and benefits of the operation with the patient as well as perioperative course expectations of outcome and alternatives to surgery and he understood and agreed to proceed forward.  Operative procedure: Patient was brought into the OR was induced under general anesthesia positioned supine the neck in slight extension 5 pounds of halter traction.  The right side of his neck was prepped and draped in routine sterile fashion.  Preoperative x-ray localized the appropriate level.  A curvilinear incision was made just off midline to the intraportal of the sternocleidomastoid and the superficial abscess was dissected out divided longitudinally.  The avascular plane between the sternomastoid and strap muscle was developed down to the prevertebral fascia and prevertebral fascia was dissected way with Kitners.  Interoperative x-ray confirmed defecation appropriate level so longus close reflected laterally self-retaining retractors placed Caspar distracting pins were placed to space was incised and anterior osteophytes were removed with a 2 and 3 Miller Kerrison punch and high-speed drill.  Under microscopic illumination further drilling down the posterior annuluscomplex identification of the posterior logical ligament which was partially calcified.   This was teased off of the dura aggressive under biting both endplates allowed identification of the thecal sac and decompressing the central canal.  Marching laterally both C7 pedicles identified and both C7 nerve roots were decompressed widely and skeletonized flush with the pedicle.  At the end decompression was no further stenosis either centrally or foraminally I sized up a 6 mm allograft spacer and a 14 mm globus resonate plate I placed 2-13 mm screws but one of them strips of the fourth screw was a 12 mm rescue.  At this point meticulous hemostasis was maintained the wound was closed in layers interrupted Vicryl and platysma and a running 4 subcuticular.  Dermabond benzoin Steri-Strips and a sterile dressing was applied patient recovery room in stable condition.  At the end the case I note needle count sponge counts were correct.

## 2023-04-11 NOTE — Evaluation (Signed)
Physical Therapy Brief Evaluation and Discharge Note Patient Details Name: Terrance Snyder MRN: [id number] DOB: January 18, 1956 Today's Date: 04/11/2023   History of Present Illness  Pt is a 68 y.o. male s/p C6-7 ACDF. PMH significant for cervical surgery 2017, bil hip replacement 2017, HLD, HTN, gout, sleep apnea.  Clinical Impression  Pt presents to PT after ACDF with gait deficits that have been chronic to some extent. Family has been assisting pt as needed at home. Feel he could benefit from HHPT.        PT Assessment All further PT needs can be met in the next venue of care  Assistance Needed at Discharge  Intermittent Supervision/Assistance    Equipment Recommendations None recommended by PT  Recommendations for Other Services       Precautions/Restrictions Precautions Precautions: Cervical;Fall Precaution Booklet Issued: Yes (comment) Recall of Precautions/Restrictions: Intact Required Braces or Orthoses: Cervical Brace Cervical Brace: Soft collar;For comfort Restrictions Weight Bearing Restrictions Per Provider Order: No        Mobility  Bed Mobility       General bed mobility comments: Pt sitting EOB  Transfers Overall transfer level: Needs assistance Equipment used: Rolling walker (2 wheels) Transfers: Sit to/from Stand Sit to Stand: Min assist, From elevated surface           General transfer comment: assist to power up    Ambulation/Gait Ambulation/Gait assistance: Contact guard assist Gait Distance (Feet): 30 Feet Assistive device: Rolling walker (2 wheels) Gait Pattern/deviations: Step-through pattern, Decreased stride length, Wide base of support, Trunk flexed Gait Speed: Below normal General Gait Details: Pt reports this if improvement of gait with him being able to take bigger steps today  Home Activity Instructions    Stairs            Modified Rankin (Stroke Patients Only)        Balance Overall balance assessment: Needs  assistance Sitting-balance support: No upper extremity supported, Feet supported Sitting balance-Leahy Scale: Good     Standing balance support: Bilateral upper extremity supported, During functional activity Standing balance-Leahy Scale: Poor Standing balance comment: reliant on RW          Pertinent Vitals/Pain PT - Brief Vital Signs All Vital Signs Stable: Yes Pain Assessment Pain Assessment: Faces Faces Pain Scale: Hurts even more Pain Location: rt knee Pain Descriptors / Indicators: Aching, Sore Pain Intervention(s): Limited activity within patient's tolerance, Repositioned     Home Living Family/patient expects to be discharged to:: Private residence Living Arrangements: Spouse/significant other Available Help at Discharge: Family Home Environment: Ramped entrance   Home Equipment: Agricultural consultant (2 wheels);Cane - single point;Shower seat;Wheelchair - IT trainer        Prior Function Level of Independence: Needs assistance Comments: Family assist with transfers. Family rolls pt in/out of house in w/c.    UE/LE Assessment   UE ROM/Strength/Tone/Coordination:  (defer to OT)    LE ROM/Strength/Tone/Coordination: Generalized weakness      Communication   Communication Communication: No apparent difficulties     Cognition Overall Cognitive Status: Appears within functional limits for tasks assessed/performed       General Comments General comments (skin integrity, edema, etc.): VSS    Exercises     Assessment/Plan    PT Problem List Decreased strength;Decreased balance;Decreased mobility;Pain       PT Visit Diagnosis Unsteadiness on feet (R26.81);Other abnormalities of gait and mobility (R26.89);Muscle weakness (generalized) (M62.81)    No Skilled PT     Co-evaluation  AMPAC 6 Clicks Help needed turning from your back to your side while in a flat bed without using bedrails?: None Help needed moving from lying  on your back to sitting on the side of a flat bed without using bedrails?: A Little Help needed moving to and from a bed to a chair (including a wheelchair)?: A Little Help needed standing up from a chair using your arms (e.g., wheelchair or bedside chair)?: A Little Help needed to walk in hospital room?: A Little Help needed climbing 3-5 steps with a railing? : A Lot 6 Click Score: 18      End of Session Equipment Utilized During Treatment: Gait belt;Cervical collar Activity Tolerance: Patient tolerated treatment well Patient left: in bed;with call bell/phone within reach (sitting EOB) Nurse Communication: Mobility status PT Visit Diagnosis: Unsteadiness on feet (R26.81);Other abnormalities of gait and mobility (R26.89);Muscle weakness (generalized) (M62.81)     Time: 1640-1650 PT Time Calculation (min) (ACUTE ONLY): 10 min  Charges:   PT Evaluation $PT Eval Low Complexity: 1 Low      [facility] PT Acute Rehabilitation Services Office [phone]   Angelina Ok [facility]  04/11/2023, 5:13 PM

## 2023-04-13 ENCOUNTER — Encounter (HOSPITAL_COMMUNITY): Payer: Self-pay | Admitting: Neurosurgery

## 2023-04-13 NOTE — Anesthesia Postprocedure Evaluation (Signed)
Anesthesia Post Note  Patient: Engineer, drilling  Procedure(s) Performed: Anterior Cervical Decompression Fusion  - Cervical six-Cervical seven     Patient location during evaluation: PACU Anesthesia Type: General Level of consciousness: awake and alert Pain management: pain level controlled Vital Signs Assessment: post-procedure vital signs reviewed and stable Respiratory status: spontaneous breathing, nonlabored ventilation and respiratory function stable Cardiovascular status: blood pressure returned to baseline and stable Postop Assessment: no apparent nausea or vomiting Anesthetic complications: no   No notable events documented.              Jese Comella

## 2023-04-15 MED FILL — Thrombin For Soln 5000 Unit: CUTANEOUS | Qty: 2 | Status: AC

## 2023-07-05 ENCOUNTER — Other Ambulatory Visit: Payer: Self-pay | Admitting: Neurosurgery

## 2023-07-19 NOTE — Progress Notes (Signed)
 Surgical Instructions   Your procedure is scheduled on Monday, June 9th, 2025. Report to [facility] Main Entrance "A" at 5:30 A.M., then check in with the Admitting office. Any questions or running late day of surgery: call [phone]  Questions prior to your surgery date: call [phone], Monday-Friday, 8am-4pm. If you experience any cold or flu symptoms such as cough, fever, chills, shortness of breath, etc. between now and your scheduled surgery, please notify us  at the above number.     Remember:  Do not eat or drink after midnight the night before your surgery     Take these medicines the morning of surgery with A SIP OF WATER: Amlodipine  (Norvasc ) Ezetimibe  (Zetia )   May take these medicines IF NEEDED: None.     One week prior to surgery, STOP taking any Aspirin (unless otherwise instructed by your surgeon) Aleve, Naproxen, Ibuprofen , Motrin , Advil , Goody's, BC's, all herbal medications, fish oil, and non-prescription vitamins.                     Do NOT Smoke (Tobacco/Vaping) for 24 hours prior to your procedure.  If you use a CPAP at night, you may bring your mask/headgear for your overnight stay.   You will be asked to remove any contacts, glasses, piercing's, hearing aid's, dentures/partials prior to surgery. Please bring cases for these items if needed.    Patients discharged the day of surgery will not be allowed to drive home, and someone needs to stay with them for 24 hours.  SURGICAL WAITING ROOM VISITATION Patients may have no more than 2 support people in the waiting area - these visitors may rotate.   Pre-op nurse will coordinate an appropriate time for 1 ADULT support person, who may not rotate, to accompany patient in pre-op.  Children under the age of 65 must have an adult with them who is not the patient and must remain in the main waiting area with an adult.  If the patient needs to stay at the hospital during part of their recovery, the visitor  guidelines for inpatient rooms apply.  Please refer to the [facility] website for the visitor guidelines for any additional information.   If you received a COVID test during your pre-op visit  it is requested that you wear a mask when out in public, stay away from anyone that may not be feeling well and notify your surgeon if you develop symptoms. If you have been in contact with anyone that has tested positive in the last 10 days please notify you surgeon.      Pre-operative 5 CHG Bathing Instructions   You can play a key role in reducing the risk of infection after surgery. Your skin needs to be as free of germs as possible. You can reduce the number of germs on your skin by washing with CHG (chlorhexidine  gluconate) soap before surgery. CHG is an antiseptic soap that kills germs and continues to kill germs even after washing.   DO NOT use if you have an allergy to chlorhexidine /CHG or antibacterial soaps. If your skin becomes reddened or irritated, stop using the CHG and notify one of our RNs at [phone].   Please shower with the CHG soap starting 4 days before surgery using the following schedule:     Please keep in mind the following:  DO NOT shave, including legs and underarms, starting the day of your first shower.   You may shave your face at any point before/day of  surgery.  Place clean sheets on your bed the day you start using CHG soap. Use a clean washcloth (not used since being washed) for each shower. DO NOT sleep with pets once you start using the CHG.   CHG Shower Instructions:  Wash your face and private area with normal soap. If you choose to wash your hair, wash first with your normal shampoo.  After you use shampoo/soap, rinse your hair and body thoroughly to remove shampoo/soap residue.  Turn the water OFF and apply about 3 tablespoons (45 ml) of CHG soap to a CLEAN washcloth.  Apply CHG soap ONLY FROM YOUR NECK DOWN TO YOUR TOES (washing for 3-5 minutes)   DO NOT use CHG soap on face, private areas, open wounds, or sores.  Pay special attention to the area where your surgery is being performed.  If you are having back surgery, having someone wash your back for you may be helpful. Wait 2 minutes after CHG soap is applied, then you may rinse off the CHG soap.  Pat dry with a clean towel  Put on clean clothes/pajamas   If you choose to wear lotion, please use ONLY the CHG-compatible lotions that are listed below.  Additional instructions for the day of surgery: DO NOT APPLY any lotions, deodorants, cologne, or perfumes.   Do not bring valuables to the hospital. [facility] is not responsible for any belongings/valuables. Do not wear nail polish, gel polish, artificial nails, or any other type of covering on natural nails (fingers and toes) Do not wear jewelry or makeup Put on clean/comfortable clothes.  Please brush your teeth.  Ask your nurse before applying any prescription medications to the skin.     CHG Compatible Lotions   Aveeno Moisturizing lotion  Cetaphil Moisturizing Cream  Cetaphil Moisturizing Lotion  Clairol Herbal Essence Moisturizing Lotion, Dry Skin  Clairol Herbal Essence Moisturizing Lotion, Extra Dry Skin  Clairol Herbal Essence Moisturizing Lotion, Normal Skin  Curel Age Defying Therapeutic Moisturizing Lotion with Alpha Hydroxy  Curel Extreme Care Body Lotion  Curel Soothing Hands Moisturizing Hand Lotion  Curel Therapeutic Moisturizing Cream, Fragrance-Free  Curel Therapeutic Moisturizing Lotion, Fragrance-Free  Curel Therapeutic Moisturizing Lotion, Original Formula  Eucerin Daily Replenishing Lotion  Eucerin Dry Skin Therapy Plus Alpha Hydroxy Crme  Eucerin Dry Skin Therapy Plus Alpha Hydroxy Lotion  Eucerin Original Crme  Eucerin Original Lotion  Eucerin Plus Crme Eucerin Plus Lotion  Eucerin TriLipid Replenishing Lotion  Keri Anti-Bacterial Hand Lotion  Keri Deep Conditioning Original Lotion Dry  Skin Formula Softly Scented  Keri Deep Conditioning Original Lotion, Fragrance Free Sensitive Skin Formula  Keri Lotion Fast Absorbing Fragrance Free Sensitive Skin Formula  Keri Lotion Fast Absorbing Softly Scented Dry Skin Formula  Keri Original Lotion  Keri Skin Renewal Lotion Keri Silky Smooth Lotion  Keri Silky Smooth Sensitive Skin Lotion  Nivea Body Creamy Conditioning Oil  Nivea Body Extra Enriched Lotion  Nivea Body Original Lotion  Nivea Body Sheer Moisturizing Lotion Nivea Crme  Nivea Skin Firming Lotion  NutraDerm 30 Skin Lotion  NutraDerm Skin Lotion  NutraDerm Therapeutic Skin Cream  NutraDerm Therapeutic Skin Lotion  ProShield Protective Hand Cream  Provon moisturizing lotion  Please read over the following fact sheets that you were given.

## 2023-07-20 ENCOUNTER — Encounter (HOSPITAL_COMMUNITY): Payer: Self-pay

## 2023-07-20 ENCOUNTER — Encounter (HOSPITAL_COMMUNITY)
Admission: RE | Admit: 2023-07-20 | Discharge: 2023-07-20 | Disposition: A | Discharge status: Home / Self Care | Source: Ambulatory Visit | Attending: Neurosurgery | Admitting: Neurosurgery

## 2023-07-20 ENCOUNTER — Other Ambulatory Visit: Payer: Self-pay

## 2023-07-20 VITALS — BP 120/66 | HR 72 | Temp 98.2°F | Resp 19 | Ht 75.0 in | Wt 273.0 lb

## 2023-07-20 DIAGNOSIS — Z01818 Encounter for other preprocedural examination: Secondary | ICD-10-CM | POA: Insufficient documentation

## 2023-07-20 HISTORY — DX: Myoneural disorder, unspecified: G70.9

## 2023-07-20 LAB — CBC
HCT: 39.1 % (ref 39.0–52.0)
Hemoglobin: 12.6 g/dL — ABNORMAL LOW (ref 13.0–17.0)
MCH: 27.5 pg (ref 26.0–34.0)
MCHC: 32.2 g/dL (ref 30.0–36.0)
MCV: 85.4 fL (ref 80.0–100.0)
Platelets: 250 10*3/uL (ref 150–400)
RBC: 4.58 MIL/uL (ref 4.22–5.81)
RDW: 15.4 % (ref 11.5–15.5)
WBC: 4.7 10*3/uL (ref 4.0–10.5)
nRBC: 0 % (ref 0.0–0.2)

## 2023-07-20 LAB — BASIC METABOLIC PANEL WITH GFR
Anion gap: 5 (ref 5–15)
BUN: 8 mg/dL (ref 8–23)
CO2: 28 mmol/L (ref 22–32)
Calcium: 10 mg/dL (ref 8.9–10.3)
Chloride: 104 mmol/L (ref 98–111)
Creatinine, Ser: 0.96 mg/dL (ref 0.61–1.24)
GFR, Estimated: >60 mL/min (ref >60)
Glucose, Bld: 108 mg/dL — ABNORMAL HIGH (ref 70–99)
Potassium: 3.5 mmol/L (ref 3.5–5.1)
Sodium: 137 mmol/L (ref 135–145)

## 2023-07-20 LAB — SURGICAL PCR SCREEN
MRSA, PCR: NEGATIVE
Staphylococcus aureus: NEGATIVE

## 2023-07-20 NOTE — Progress Notes (Signed)
 PCP - Lazoff, Shawn P, DO  Cardiologist - Clay Cummins, MD  Rheumatologist - Jeralyn Mon, MD                                                                    PPM/ICD - denies Device Orders -  Rep Notified -   Chest x-ray -  EKG - 07/20/23 Stress Test - denies ECHO - TTE 08/16/22 Cardiac Cath - denies  Sleep Study - denies CPAP -   Fasting Blood Sugar - na Checks Blood Sugar _____ times a day  Last dose of GLP1 agonist-  na GLP1 instructions:   Blood Thinner Instructions:na Aspirin Instructions:na  ERAS Protcol -no PRE-SURGERY Ensure or G2-   COVID TEST- na   Anesthesia review: no - chart reviewed by Ella Gun, PA-C 04/05/23.  No changes in health status since that time.   Patient denies shortness of breath, fever, cough and chest pain at PAT appointment   All instructions explained to the patient, with a verbal understanding of the material. Patient agrees to go over the instructions while at home for a better understanding.The opportunity to ask questions was provided.

## 2023-07-27 NOTE — Progress Notes (Signed)
 Patient wife informed of arrival time change for provedure on 07/30/2023. Patient to arrive at 0730.

## 2023-07-29 NOTE — Anesthesia Preprocedure Evaluation (Signed)
 Anesthesia Evaluation  Patient identified by MRN, date of birth, ID band Patient awake    Reviewed: Allergy & Precautions, NPO status , Patient's Chart, lab work & pertinent test results  History of Anesthesia Complications Negative for: history of anesthetic complications  Airway Mallampati: II  TM Distance: >3 FB Neck ROM: Full    Dental  (+) Missing,    Pulmonary sleep apnea and Continuous Positive Airway Pressure Ventilation , Current Smoker and Patient abstained from smoking.   Pulmonary exam normal        Cardiovascular hypertension, Pt. on medications Normal cardiovascular exam     Neuro/Psych Lumbar stenosis with neurogenic claudication    GI/Hepatic Neg liver ROS,GERD  Controlled,,  Endo/Other  negative endocrine ROS    Renal/GU negative Renal ROS     Musculoskeletal  (+) Arthritis ,    Abdominal   Peds  Hematology  (+) Blood dyscrasia (Hgb 12.6), anemia   Anesthesia Other Findings Day of surgery medications reviewed with patient.  Reproductive/Obstetrics negative OB ROS                              Anesthesia Physical Anesthesia Plan  ASA: 2  Anesthesia Plan: General   Post-op Pain Management: Tylenol  PO (pre-op)*, Ketamine IV* and Dilaudid  IV   Induction: Intravenous  PONV Risk Score and Plan: 3 and Treatment may vary due to age or medical condition, Ondansetron , Dexamethasone  and Midazolam   Airway Management Planned: Oral ETT  Additional Equipment: None  Intra-op Plan:   Post-operative Plan: Extubation in OR  Informed Consent: I have reviewed the patients History and Physical, chart, labs and discussed the procedure including the risks, benefits and alternatives for the proposed anesthesia with the patient or authorized representative who has indicated his/her understanding and acceptance.     Dental advisory given  Plan Discussed with: CRNA  Anesthesia  Plan Comments:         Anesthesia Quick Evaluation

## 2023-07-30 ENCOUNTER — Ambulatory Visit (HOSPITAL_COMMUNITY)
Admission: RE | Admit: 2023-07-30 | Discharge: 2023-07-31 | Disposition: A | Discharge status: Home / Self Care | Attending: Neurosurgery | Admitting: Neurosurgery

## 2023-07-30 ENCOUNTER — Encounter (HOSPITAL_COMMUNITY)
Admission: RE | Disposition: A | Discharge status: Home / Self Care | Payer: Self-pay | Source: Home / Self Care | Attending: Neurosurgery

## 2023-07-30 ENCOUNTER — Ambulatory Visit (HOSPITAL_COMMUNITY)

## 2023-07-30 ENCOUNTER — Other Ambulatory Visit: Payer: Self-pay

## 2023-07-30 ENCOUNTER — Encounter (HOSPITAL_COMMUNITY): Payer: Self-pay | Admitting: Neurosurgery

## 2023-07-30 DIAGNOSIS — G4733 Obstructive sleep apnea (adult) (pediatric): Secondary | ICD-10-CM | POA: Diagnosis not present

## 2023-07-30 DIAGNOSIS — M48062 Spinal stenosis, lumbar region with neurogenic claudication: Secondary | ICD-10-CM | POA: Insufficient documentation

## 2023-07-30 DIAGNOSIS — F17210 Nicotine dependence, cigarettes, uncomplicated: Secondary | ICD-10-CM | POA: Diagnosis not present

## 2023-07-30 DIAGNOSIS — M5416 Radiculopathy, lumbar region: Secondary | ICD-10-CM | POA: Insufficient documentation

## 2023-07-30 DIAGNOSIS — M1990 Unspecified osteoarthritis, unspecified site: Secondary | ICD-10-CM | POA: Insufficient documentation

## 2023-07-30 DIAGNOSIS — I10 Essential (primary) hypertension: Secondary | ICD-10-CM | POA: Diagnosis not present

## 2023-07-30 DIAGNOSIS — G4730 Sleep apnea, unspecified: Secondary | ICD-10-CM | POA: Diagnosis not present

## 2023-07-30 DIAGNOSIS — M48061 Spinal stenosis, lumbar region without neurogenic claudication: Secondary | ICD-10-CM | POA: Diagnosis present

## 2023-07-30 DIAGNOSIS — M2578 Osteophyte, vertebrae: Secondary | ICD-10-CM | POA: Diagnosis not present

## 2023-07-30 HISTORY — PX: LUMBAR LAMINECTOMY/DECOMPRESSION MICRODISCECTOMY: SHX5026

## 2023-07-30 SURGERY — LUMBAR LAMINECTOMY/DECOMPRESSION MICRODISCECTOMY 3 LEVELS
Anesthesia: General | Site: Back | Laterality: Bilateral

## 2023-07-30 MED ORDER — SODIUM CHLORIDE 0.9% FLUSH
3.0000 mL | INTRAVENOUS | Status: DC | PRN
Start: 2023-07-30 — End: 2023-07-31

## 2023-07-30 MED ORDER — TELMISARTAN-HCTZ 80-25 MG PO TABS: 1.0000 | ORAL_TABLET | Freq: Every day | ORAL | Status: DC

## 2023-07-30 MED ORDER — LIDOCAINE 2% (20 MG/ML) 5 ML SYRINGE
INTRAMUSCULAR | Status: DC | PRN
  Administered 2023-07-30: 60 mg via INTRAVENOUS

## 2023-07-30 MED ORDER — ADALIMUMAB 40 MG/0.4ML ~~LOC~~ AJKT: 40.0000 mg | AUTO-INJECTOR | SUBCUTANEOUS | Status: DC

## 2023-07-30 MED ORDER — BUPIVACAINE HCL (PF) 0.25 % IJ SOLN
INTRAMUSCULAR | Status: AC
Start: 2023-07-30 — End: ?
  Filled 2023-07-30: qty 30

## 2023-07-30 MED ORDER — CHLORHEXIDINE GLUCONATE CLOTH 2 % EX PADS: 6.0000 | MEDICATED_PAD | Freq: Once | CUTANEOUS | Status: DC

## 2023-07-30 MED ORDER — CEFAZOLIN SODIUM-DEXTROSE 2-4 GM/100ML-% IV SOLN
2.0000 g | Freq: Three times a day (TID) | INTRAVENOUS | Status: DC
  Administered 2023-07-30 – 2023-07-31 (×2): 2 g via INTRAVENOUS
  Filled 2023-07-30 (×2): qty 100

## 2023-07-30 MED ORDER — DEXAMETHASONE SODIUM PHOSPHATE 10 MG/ML IJ SOLN
INTRAMUSCULAR | Status: DC | PRN
  Administered 2023-07-30: 10 mg via INTRAVENOUS

## 2023-07-30 MED ORDER — HYDROMORPHONE HCL 1 MG/ML IJ SOLN: 0.5000 mg | INTRAMUSCULAR | Status: DC | PRN

## 2023-07-30 MED ORDER — HYDROMORPHONE HCL 1 MG/ML IJ SOLN
INTRAMUSCULAR | Status: AC
Start: 2023-07-30 — End: ?
  Filled 2023-07-30: qty 1

## 2023-07-30 MED ORDER — ORAL CARE MOUTH RINSE: 15.0000 mL | Freq: Once | OROMUCOSAL | Status: AC

## 2023-07-30 MED ORDER — PROPOFOL 10 MG/ML IV BOLUS
INTRAVENOUS | Status: AC
  Filled 2023-07-30: qty 20

## 2023-07-30 MED ORDER — ALUM & MAG HYDROXIDE-SIMETH 200-200-20 MG/5ML PO SUSP
30.0000 mL | Freq: Four times a day (QID) | ORAL | Status: DC | PRN
Start: 2023-07-30 — End: 2023-07-31

## 2023-07-30 MED ORDER — LIDOCAINE-EPINEPHRINE 1 %-1:100000 IJ SOLN
INTRAMUSCULAR | Status: AC
  Filled 2023-07-30: qty 1

## 2023-07-30 MED ORDER — KETAMINE HCL 10 MG/ML IJ SOLN
INTRAMUSCULAR | Status: DC | PRN
  Administered 2023-07-30: 20 mg via INTRAVENOUS

## 2023-07-30 MED ORDER — KETAMINE HCL 50 MG/5ML IJ SOSY
PREFILLED_SYRINGE | INTRAMUSCULAR | Status: AC
Start: 2023-07-30 — End: ?
  Filled 2023-07-30: qty 5

## 2023-07-30 MED ORDER — ROCURONIUM BROMIDE 10 MG/ML (PF) SYRINGE
PREFILLED_SYRINGE | INTRAVENOUS | Status: AC
  Filled 2023-07-30: qty 10

## 2023-07-30 MED ORDER — ACETAMINOPHEN 650 MG RE SUPP: 650.0000 mg | RECTAL | Status: DC | PRN

## 2023-07-30 MED ORDER — CEFAZOLIN SODIUM-DEXTROSE 3-4 GM/150ML-% IV SOLN
3.0000 g | INTRAVENOUS | Status: AC
  Administered 2023-07-30: 3 g via INTRAVENOUS
  Filled 2023-07-30: qty 150

## 2023-07-30 MED ORDER — EPHEDRINE 5 MG/ML INJ
INTRAVENOUS | Status: AC
Start: 2023-07-30 — End: ?
  Filled 2023-07-30: qty 5

## 2023-07-30 MED ORDER — EVOLOCUMAB 140 MG/ML ~~LOC~~ SOAJ: 140.0000 mg | SUBCUTANEOUS | Status: DC

## 2023-07-30 MED ORDER — PROPOFOL 10 MG/ML IV BOLUS
INTRAVENOUS | Status: DC | PRN
  Administered 2023-07-30: 200 mg via INTRAVENOUS

## 2023-07-30 MED ORDER — SODIUM CHLORIDE 0.9 % IV SOLN
250.0000 mL | INTRAVENOUS | Status: DC
  Administered 2023-07-30: 250 mL via INTRAVENOUS

## 2023-07-30 MED ORDER — ONDANSETRON HCL 4 MG PO TABS: 4.0000 mg | ORAL_TABLET | Freq: Four times a day (QID) | ORAL | Status: DC | PRN

## 2023-07-30 MED ORDER — SUGAMMADEX SODIUM 200 MG/2ML IV SOLN
INTRAVENOUS | Status: DC | PRN
  Administered 2023-07-30 (×2): 200 mg via INTRAVENOUS

## 2023-07-30 MED ORDER — ONDANSETRON HCL 4 MG/2ML IJ SOLN: 4.0000 mg | Freq: Four times a day (QID) | INTRAMUSCULAR | Status: DC | PRN

## 2023-07-30 MED ORDER — DEXAMETHASONE SODIUM PHOSPHATE 10 MG/ML IJ SOLN
INTRAMUSCULAR | Status: AC
  Filled 2023-07-30: qty 1

## 2023-07-30 MED ORDER — ACETAMINOPHEN 500 MG PO TABS
1000.0000 mg | ORAL_TABLET | Freq: Once | ORAL | Status: AC
  Administered 2023-07-30: 1000 mg via ORAL
  Filled 2023-07-30: qty 2

## 2023-07-30 MED ORDER — FENTANYL CITRATE (PF) 250 MCG/5ML IJ SOLN
INTRAMUSCULAR | Status: AC
Start: 2023-07-30 — End: ?
  Filled 2023-07-30: qty 5

## 2023-07-30 MED ORDER — MIDAZOLAM HCL 2 MG/2ML IJ SOLN
INTRAMUSCULAR | Status: DC | PRN
  Administered 2023-07-30: 2 mg via INTRAVENOUS

## 2023-07-30 MED ORDER — CYCLOBENZAPRINE HCL 10 MG PO TABS
10.0000 mg | ORAL_TABLET | Freq: Three times a day (TID) | ORAL | Status: DC | PRN
  Administered 2023-07-30 – 2023-07-31 (×2): 10 mg via ORAL
  Filled 2023-07-30 (×2): qty 1

## 2023-07-30 MED ORDER — THROMBIN 5000 UNITS EX KIT
PACK | CUTANEOUS | Status: AC
  Filled 2023-07-30: qty 2

## 2023-07-30 MED ORDER — ROCURONIUM BROMIDE 10 MG/ML (PF) SYRINGE
PREFILLED_SYRINGE | INTRAVENOUS | Status: AC
Start: 2023-07-30 — End: ?
  Filled 2023-07-30: qty 10

## 2023-07-30 MED ORDER — LIDOCAINE 2% (20 MG/ML) 5 ML SYRINGE
INTRAMUSCULAR | Status: AC
  Filled 2023-07-30: qty 5

## 2023-07-30 MED ORDER — CHLORHEXIDINE GLUCONATE 0.12 % MT SOLN
15.0000 mL | Freq: Once | OROMUCOSAL | Status: AC
  Administered 2023-07-30: 15 mL via OROMUCOSAL
  Filled 2023-07-30: qty 15

## 2023-07-30 MED ORDER — GLYCOPYRROLATE 0.2 MG/ML IJ SOLN
INTRAMUSCULAR | Status: DC | PRN
Start: 2023-07-30 — End: 2023-07-30
  Administered 2023-07-30: .1 mg via INTRAVENOUS

## 2023-07-30 MED ORDER — ONDANSETRON HCL 4 MG/2ML IJ SOLN
INTRAMUSCULAR | Status: AC
  Filled 2023-07-30: qty 2

## 2023-07-30 MED ORDER — ACETAMINOPHEN 325 MG PO TABS: 650.0000 mg | ORAL_TABLET | ORAL | Status: DC | PRN

## 2023-07-30 MED ORDER — ROCURONIUM BROMIDE 10 MG/ML (PF) SYRINGE
PREFILLED_SYRINGE | INTRAVENOUS | Status: DC | PRN
  Administered 2023-07-30: 80 mg via INTRAVENOUS
  Administered 2023-07-30: 10 mg via INTRAVENOUS
  Administered 2023-07-30: 20 mg via INTRAVENOUS

## 2023-07-30 MED ORDER — LACTATED RINGERS IV SOLN: INTRAVENOUS | Status: DC

## 2023-07-30 MED ORDER — ONDANSETRON HCL 4 MG/2ML IJ SOLN
INTRAMUSCULAR | Status: DC | PRN
  Administered 2023-07-30: 4 mg via INTRAVENOUS

## 2023-07-30 MED ORDER — HYDROCODONE-ACETAMINOPHEN 5-325 MG PO TABS
2.0000 | ORAL_TABLET | ORAL | Status: DC | PRN
  Administered 2023-07-30 – 2023-07-31 (×5): 2 via ORAL
  Filled 2023-07-30 (×5): qty 2

## 2023-07-30 MED ORDER — 0.9 % SODIUM CHLORIDE (POUR BTL) OPTIME
TOPICAL | Status: DC | PRN
  Administered 2023-07-30: 1000 mL

## 2023-07-30 MED ORDER — FENTANYL CITRATE (PF) 250 MCG/5ML IJ SOLN
INTRAMUSCULAR | Status: DC | PRN
  Administered 2023-07-30: 100 ug via INTRAVENOUS
  Administered 2023-07-30 (×3): 50 ug via INTRAVENOUS

## 2023-07-30 MED ORDER — PHENYLEPHRINE HCL-NACL 20-0.9 MG/250ML-% IV SOLN
INTRAVENOUS | Status: DC | PRN
  Administered 2023-07-30: 40 ug/min via INTRAVENOUS

## 2023-07-30 MED ORDER — DROPERIDOL 2.5 MG/ML IJ SOLN: 0.6250 mg | Freq: Once | INTRAMUSCULAR | Status: DC | PRN

## 2023-07-30 MED ORDER — TURMERIC 500 MG PO CAPS: ORAL_CAPSULE | Freq: Every day | ORAL | Status: DC

## 2023-07-30 MED ORDER — PANTOPRAZOLE SODIUM 40 MG PO TBEC
40.0000 mg | DELAYED_RELEASE_TABLET | Freq: Every day | ORAL | Status: DC
  Administered 2023-07-30: 40 mg via ORAL
  Filled 2023-07-30: qty 1

## 2023-07-30 MED ORDER — HYDROMORPHONE HCL 1 MG/ML IJ SOLN
0.2500 mg | INTRAMUSCULAR | Status: DC | PRN
  Administered 2023-07-30 (×3): 0.5 mg via INTRAVENOUS

## 2023-07-30 MED ORDER — EPHEDRINE SULFATE-NACL 50-0.9 MG/10ML-% IV SOSY
PREFILLED_SYRINGE | INTRAVENOUS | Status: DC | PRN
  Administered 2023-07-30 (×2): 5 mg via INTRAVENOUS

## 2023-07-30 MED ORDER — PANTOPRAZOLE SODIUM 40 MG IV SOLR: 40.0000 mg | Freq: Every day | INTRAVENOUS | Status: DC

## 2023-07-30 MED ORDER — MIDAZOLAM HCL 2 MG/2ML IJ SOLN
INTRAMUSCULAR | Status: AC
Start: 2023-07-30 — End: ?
  Filled 2023-07-30: qty 2

## 2023-07-30 MED ORDER — SODIUM CHLORIDE 0.9% FLUSH
3.0000 mL | Freq: Two times a day (BID) | INTRAVENOUS | Status: DC
  Administered 2023-07-30 (×2): 3 mL via INTRAVENOUS

## 2023-07-30 MED ORDER — IRBESARTAN 150 MG PO TABS
300.0000 mg | ORAL_TABLET | Freq: Every day | ORAL | Status: DC
  Administered 2023-07-30 – 2023-07-31 (×2): 300 mg via ORAL
  Filled 2023-07-30 (×2): qty 2

## 2023-07-30 MED ORDER — PHENOL 1.4 % MT LIQD: 1.0000 | OROMUCOSAL | Status: DC | PRN

## 2023-07-30 MED ORDER — HYDROCHLOROTHIAZIDE 25 MG PO TABS
25.0000 mg | ORAL_TABLET | Freq: Every day | ORAL | Status: DC
  Administered 2023-07-30 – 2023-07-31 (×2): 25 mg via ORAL
  Filled 2023-07-30 (×2): qty 1

## 2023-07-30 MED ORDER — BUPIVACAINE HCL (PF) 0.25 % IJ SOLN
INTRAMUSCULAR | Status: DC | PRN
  Administered 2023-07-30: 10 mL

## 2023-07-30 MED ORDER — LIDOCAINE-EPINEPHRINE 1 %-1:100000 IJ SOLN
INTRAMUSCULAR | Status: DC | PRN
  Administered 2023-07-30: 10 mL

## 2023-07-30 MED ORDER — GLYCOPYRROLATE 0.2 MG/ML IJ SOLN
INTRAMUSCULAR | Status: AC
  Filled 2023-07-30: qty 1

## 2023-07-30 MED ORDER — THROMBIN (RECOMBINANT) 5000 UNITS EX SOLR
CUTANEOUS | Status: DC | PRN
  Administered 2023-07-30: 10 mL via TOPICAL

## 2023-07-30 MED ORDER — MENTHOL 3 MG MT LOZG: 1.0000 | LOZENGE | OROMUCOSAL | Status: DC | PRN

## 2023-07-30 MED ORDER — AMLODIPINE BESYLATE 10 MG PO TABS
10.0000 mg | ORAL_TABLET | Freq: Every day | ORAL | Status: DC
  Administered 2023-07-31: 10 mg via ORAL
  Filled 2023-07-30: qty 1

## 2023-07-30 SURGICAL SUPPLY — 49 items
BAG COUNTER SPONGE SURGICOUNT (BAG) ×1 IMPLANT
BAND RUBBER #18 3X1/16 STRL (MISCELLANEOUS) ×2 IMPLANT
BENZOIN TINCTURE PRP APPL 2/3 (GAUZE/BANDAGES/DRESSINGS) ×1 IMPLANT
BLADE CLIPPER SURG (BLADE) IMPLANT
BLADE SURG 11 STRL SS (BLADE) ×1 IMPLANT
BUR CUTTER 7.0 ROUND (BURR) ×1 IMPLANT
BUR MATCHSTICK NEURO 3.0 LAGG (BURR) ×1 IMPLANT
CANISTER SUCTION 3000ML PPV (SUCTIONS) ×1 IMPLANT
DERMABOND ADVANCED .7 DNX12 (GAUZE/BANDAGES/DRESSINGS) ×1 IMPLANT
DRAPE HALF SHEET 40X57 (DRAPES) IMPLANT
DRAPE LAPAROTOMY 100X72X124 (DRAPES) ×1 IMPLANT
DRAPE MICROSCOPE SLANT 54X150 (MISCELLANEOUS) ×1 IMPLANT
DRAPE SURG 17X23 STRL (DRAPES) ×1 IMPLANT
DRSG OPSITE 4X5.5 SM (GAUZE/BANDAGES/DRESSINGS) IMPLANT
DRSG OPSITE POSTOP 4X8 (GAUZE/BANDAGES/DRESSINGS) IMPLANT
DURAPREP 26ML APPLICATOR (WOUND CARE) ×1 IMPLANT
ELECTRODE REM PT RTRN 9FT ADLT (ELECTROSURGICAL) ×1 IMPLANT
EVACUATOR 1/8 PVC DRAIN (DRAIN) IMPLANT
GAUZE 4X4 16PLY ~~LOC~~+RFID DBL (SPONGE) IMPLANT
GAUZE SPONGE 4X4 12PLY STRL (GAUZE/BANDAGES/DRESSINGS) ×1 IMPLANT
GLOVE BIO SURGEON STRL SZ7 (GLOVE) IMPLANT
GLOVE BIO SURGEON STRL SZ8 (GLOVE) ×1 IMPLANT
GLOVE BIOGEL PI IND STRL 7.0 (GLOVE) IMPLANT
GLOVE ECLIPSE 7.5 STRL STRAW (GLOVE) IMPLANT
GLOVE EXAM NITRILE XL STR (GLOVE) IMPLANT
GLOVE INDICATOR 8.5 STRL (GLOVE) ×2 IMPLANT
GOWN STRL REUS W/ TWL LRG LVL3 (GOWN DISPOSABLE) ×1 IMPLANT
GOWN STRL REUS W/ TWL XL LVL3 (GOWN DISPOSABLE) ×2 IMPLANT
GOWN STRL REUS W/TWL 2XL LVL3 (GOWN DISPOSABLE) IMPLANT
KIT BASIN OR (CUSTOM PROCEDURE TRAY) ×1 IMPLANT
KIT TURNOVER KIT B (KITS) ×1 IMPLANT
NDL HYPO 22X1.5 SAFETY MO (MISCELLANEOUS) ×1 IMPLANT
NDL SPNL 22GX3.5 QUINCKE BK (NEEDLE) ×1 IMPLANT
NEEDLE HYPO 22X1.5 SAFETY MO (MISCELLANEOUS) ×1 IMPLANT
NEEDLE SPNL 22GX3.5 QUINCKE BK (NEEDLE) ×1 IMPLANT
NS IRRIG 1000ML POUR BTL (IV SOLUTION) ×1 IMPLANT
PACK LAMINECTOMY NEURO (CUSTOM PROCEDURE TRAY) ×1 IMPLANT
PATTIES SURGICAL .5 X.5 (GAUZE/BANDAGES/DRESSINGS) ×1 IMPLANT
PATTIES SURGICAL 1X1 (DISPOSABLE) ×1 IMPLANT
SPIKE FLUID TRANSFER (MISCELLANEOUS) ×1 IMPLANT
SPONGE SURGIFOAM ABS GEL SZ50 (HEMOSTASIS) ×1 IMPLANT
STRIP CLOSURE SKIN 1/2X4 (GAUZE/BANDAGES/DRESSINGS) ×1 IMPLANT
SUT VIC AB 0 CT1 18XCR BRD8 (SUTURE) ×1 IMPLANT
SUT VIC AB 2-0 CT1 18 (SUTURE) ×1 IMPLANT
SUT VIC AB 4-0 PS2 27 (SUTURE) ×1 IMPLANT
SYR 30ML SLIP (SYRINGE) ×1 IMPLANT
TOWEL GREEN STERILE (TOWEL DISPOSABLE) ×1 IMPLANT
TOWEL GREEN STERILE FF (TOWEL DISPOSABLE) ×1 IMPLANT
WATER STERILE IRR 1000ML POUR (IV SOLUTION) ×1 IMPLANT

## 2023-07-30 NOTE — Plan of Care (Signed)

## 2023-07-30 NOTE — H&P (Signed)
 Terrance Snyder is an 68 y.o. male.   Chief Complaint: Back and bilateral hip and leg pain HPI: 68 year old gentleman with longstanding issues with his neck and his back discussed progressive worsening back bilateral hip and leg pain and claudication.  Workup has revealed progressive and severe stenosis at L1-L2 3 as well as elevated L3-4.  Due to patient's progression of clinical syndrome imaging findings failed conservative treatment I recommended decompressive laminectomies foraminotomies partial facetectomies at those levels.  I extensively reviewed the risks and benefits of the operation with the patient as well as perioperative course expectations of outcome and alternatives to surgery and he understood and agreed to proceed forward.  Past Medical History:  Diagnosis Date   Complication of anesthesia    2017unable to intubating d/t a mass which has since been removed.   Diverticulosis    ED (erectile dysfunction)    GERD (gastroesophageal reflux disease)    Gout    Hyperlipidemia    Hypertension    Impaired fasting glucose    Neuromuscular disorder (HCC)    neuropathy bilateral feet   Osteoarthritis    Sleep apnea    wears CPAP   Vallecular cyst     Past Surgical History:  Procedure Laterality Date   ACHILLES TENDON SURGERY     Right   ANTERIOR CERVICAL DECOMP/DISCECTOMY FUSION N/A 04/11/2023   Procedure: Anterior Cervical Decompression Fusion  - Cervical six-Cervical seven;  Surgeon: Gearl Keens, MD;  Location: [facility] OR;  Service: Neurosurgery;  Laterality: N/A;  3C   CERVICAL DISC SURGERY  12/2015   by Dr Lamon Pillow at [facility]   DIRECT LARYNGOSCOPY N/A 01/17/2016   Procedure: DIRECT LARYNGOSCOPY;  Surgeon: Reynold Caves, MD;  Location: [facility];  Service: ENT;  Laterality: N/A;   JOINT REPLACEMENT Left 2015   hip replacement   MASS EXCISION N/A 01/17/2016   Procedure: EXCISION MASS;  Surgeon: Reynold Caves, MD;  Location: [facility];  Service: ENT;  Laterality: N/A;    TONSILLECTOMY     TOTAL HIP ARTHROPLASTY Right 2005    Family History  Problem Relation Age of Onset   CVA Mother    Rheum arthritis Mother    Hyperlipidemia Mother    Hyperlipidemia Father    Cancer Father        Colon Cancer   Social History:  reports that he has been smoking cigarettes. He has never used smokeless tobacco. He reports current alcohol use. He reports that he does not use drugs.  Allergies:  Allergies  Allergen Reactions   Shellfish Allergy Nausea And Vomiting    Medications Prior to Admission  Medication Sig Dispense Refill   amLODipine  (NORVASC ) 10 MG tablet TAKE 1 TABLET(10 MG) BY MOUTH DAILY     ezetimibe  (ZETIA ) 10 MG tablet Take 1 tablet (10 mg total) by mouth daily. 90 tablet 3   HUMIRA PEN 40 MG/0.4ML PNKT Inject 40 mg into the skin every 14 (fourteen) days.     MICARDIS  HCT 80-25 MG tablet Take 1 tablet by mouth daily.     REPATHA  SURECLICK 140 MG/ML SOAJ Inject 140 mg into the skin every 14 (fourteen) days.     TURMERIC PO Take 1 tablet by mouth daily.      No results found for this or any previous visit (from the past 48 hours). No results found.  Review of Systems  Musculoskeletal:  Positive for back pain.  Neurological:  Positive for numbness.    Blood pressure (!) 157/77,  pulse 65, temperature 98.6 F (37 C), temperature source Oral, resp. rate 17, height 6\' 3"  (1.905 m), weight 122.5 kg, SpO2 98%. Physical Exam HENT:     Head: Normocephalic.     Right Ear: Tympanic membrane normal.     Nose: Nose normal.     Mouth/Throat:     Mouth: Mucous membranes are moist.  Cardiovascular:     Rate and Rhythm: Normal rate.     Pulses: Normal pulses.  Pulmonary:     Effort: Pulmonary effort is normal.  Musculoskeletal:     Cervical back: Normal range of motion.  Neurological:     Mental Status: He is alert.     Comments: Strength is 5 out of 5 iliopsoas, quads, hams, gastrocs, tibialis, EHL.      Assessment/Plan Patient presents for  decompressive laminectomies bilateral L1-2 L2-3 L3-4  Ferris Hua, MD 07/30/2023, 9:03 AM

## 2023-07-30 NOTE — Anesthesia Postprocedure Evaluation (Signed)
 Anesthesia Post Note  Patient: Engineer, drilling  Procedure(s) Performed: LUMBAR LAMINECTOMY/DECOMPRESSION MICRODISCECTOMY LUMBAR ONE-LUMBAR TWO - LUMBAR TWO-LUMBAR THREE - BILATERAL - LUMBAR THREE-LUMBAR FOUR - LEFT (Bilateral: Back)     Patient location during evaluation: PACU Anesthesia Type: General Level of consciousness: awake and alert Pain management: pain level controlled Vital Signs Assessment: post-procedure vital signs reviewed and stable Respiratory status: spontaneous breathing, nonlabored ventilation, respiratory function stable and patient connected to nasal cannula oxygen Cardiovascular status: blood pressure returned to baseline and stable Postop Assessment: no apparent nausea or vomiting Anesthetic complications: no  No notable events documented.  Last Vitals:  Vitals:   07/30/23 1330 07/30/23 1345  BP: 129/67 114/82  Pulse: (!) 56 60  Resp: 17 12  Temp:  36.5 C  SpO2: 93% 98%    Last Pain:  Vitals:   07/30/23 1312  TempSrc:   PainSc: Asleep   Pain Goal:    LLE Motor Response: Purposeful movement (07/30/23 1345) LLE Sensation: Full sensation (07/30/23 1345) RLE Motor Response: Purposeful movement (07/30/23 1345) RLE Sensation: Full sensation (07/30/23 1345)        Rosalita Combe

## 2023-07-30 NOTE — Anesthesia Procedure Notes (Addendum)
 Procedure Name: Intubation Date/Time: 07/30/2023 9:39 AM  Performed by: Linard Reno, CRNAPre-anesthesia Checklist: Patient identified, Emergency Drugs available, Suction available and Patient being monitored Patient Re-evaluated:Patient Re-evaluated prior to induction Oxygen Delivery Method: Circle System Utilized Preoxygenation: Pre-oxygenation with 100% oxygen Induction Type: IV induction Ventilation: Two handed mask ventilation required and Oral airway inserted - appropriate to patient size Laryngoscope Size: Glidescope and 4 Grade View: Grade I Tube type: Oral Tube size: 7.5 mm Number of attempts: 1 Airway Equipment and Method: Stylet and Oral airway Placement Confirmation: ETT inserted through vocal cords under direct vision, positive ETCO2 and breath sounds checked- equal and bilateral Secured at: 25 cm Tube secured with: Tape Dental Injury: Teeth and Oropharynx as per pre-operative assessment  Comments: Intubation by Darlina Einstein, SRNA.

## 2023-07-30 NOTE — Progress Notes (Signed)
 PHARMACIST - PHYSICIAN ORDER COMMUNICATION  CONCERNING: P&T Medication Policy on Herbal Medications  DESCRIPTION:  This patient's order for:  Turmeric  has been noted.  This product(s) is classified as an "herbal" or natural product. Due to a lack of definitive safety studies or FDA approval, nonstandard manufacturing practices, plus the potential risk of unknown drug-drug interactions while on inpatient medications, the Pharmacy and Therapeutics Committee does not permit the use of "herbal" or natural products of this type within [facility].   ACTION TAKEN: The pharmacy department is unable to verify this order at this time and your patient has been informed of this safety policy. Please reevaluate patient's clinical condition at discharge and address if the herbal or natural product(s) should be resumed at that time.  Enrigue Harvard, PharmD, BCPS Please see amion for complete clinical pharmacist phone list 07/30/2023 3:12 PM

## 2023-07-30 NOTE — Op Note (Signed)
 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  Postoperative diagnosis: Same.  Procedure: Decompressive lumbar laminectomy L1-L2 3 with partial medial facetectomies and foraminotomies of the L1 and L2 and L3 as well as redo decompressive laminectomy at L3-4 with partial facetectomies  Surgeon: Gearl Keens.  Assistant: Beula Brunswick.  Anesthesia: General.  EBL: Minimal.  HPI: 68 year old gentleman who presented with back pain bilateral hip and leg pain rating down primarily L2-L3 distribution workup revealed severe spinal stenosis at L2-3 and also at L1-2 and partial at L3-4 he had previous left-sided discectomy at L3-4 due to the patient patient's progression of clinical syndrome imaging findings of a conservative treatment I recommended decompressive laminectomies at L1-2 L2-3 and then extended into the L3-4 area primarily at the beginning of the top of the previously decompressed level at L3-4 I extensively reviewed the risks and benefits of the operation with patient as well as perioperative course expectations of outcome and alternatives to surgery and he understood and agreed to proceed forward.  We did planned on focusing primarily on the left at L3-4 however due to extensive scar tissue and anatomy I had to decompress some of the right in order to gain access to normal anatomy to work the left.  So bilateral L3-4 was performed.  Operative procedure: Patient was brought into the OR was Duson general anesthesia positioned prone the Wilson frame his back was prepped and draped in routine sterile fashion.  Old incision was used to mark the L3-4 area after infiltration of 10 cc lidocaine  with epi and incision was extended cephalad subperiosteal dissection was carried lamina of L1-L2-L3 and the residual of the scar tissue at L3-4.  Interoperative x-ray confirmed the L2 pedicle so I then remove the spinous process of L1  and L2 and part of the spinous process of L3 there was an extensive amount of hourglass compression of the thecal sac and severe overgrowth the facets immediately so I had to drill down the medial facets then spinal laminar complex to gain access to the central canal and first working at 2 3 marching superiorly and I extended up to above the L1-2 disc space and then under bit the medial facets and performed foraminotomies of the L1-L2 nerve roots bilaterally extensive scar tissue was then freed up the top of the previous L3-4 area and extended the decompression down to the inferior aspect of the L3 pedicle at the level of the L3-4 disc space performing foraminotomies the L3 nerve roots bilaterally with redo foraminotomies on the left.  There was severe osteophytes and stenosis coming off the medial facet joints at all those levels L1-L2 3 in the superior aspect of L3-4 and all this was freed up so at the end of the decompression I was easily able to pass a coronary dilator out the foramina of L1-L2 and L3 bilaterally.  The wound was then copiously irrigated meticulous hemostasis was maintained Gelfoam atop the dura placed a medium Hemovac drain injected Marcaine in the fascia and closed the wound in layers with interrupted Vicryl and a running 4-0 subcuticular.  Dermabond benzoin Steri-Strips and a sterile dressing was applied patient to cover him in stable condition.  At the end the case all needle, sponge counts were correct.

## 2023-07-30 NOTE — Transfer of Care (Signed)
 Immediate Anesthesia Transfer of Care Note  Patient: Engineer, drilling  Procedure(s) Performed: LUMBAR LAMINECTOMY/DECOMPRESSION MICRODISCECTOMY LUMBAR ONE-LUMBAR TWO - LUMBAR TWO-LUMBAR THREE - BILATERAL - LUMBAR THREE-LUMBAR FOUR - LEFT (Bilateral: Back)  Patient Location: PACU  Anesthesia Type:General  Level of Consciousness: awake  Airway & Oxygen Therapy: Patient Spontanous Breathing and Patient connected to face mask oxygen  Post-op Assessment: Report given to RN and Post -op Vital signs reviewed and stable  Post vital signs: Reviewed and stable  Last Vitals:  Vitals Value Taken Time  BP 82/73 07/30/23 1227  Temp    Pulse 63 07/30/23 1231  Resp 19 07/30/23 1231  SpO2 97 % 07/30/23 1231  Vitals shown include unfiled device data.  Last Pain:  Vitals:   07/30/23 0750  TempSrc:   PainSc: 10-Worst pain ever         Complications: No notable events documented.

## 2023-07-30 NOTE — Progress Notes (Signed)
 Pt not yet ready at this time to put CPAP on for the night. RT helped pt get settings and mask/tubing connected. Pt stated he can place himself on when he is ready

## 2023-07-31 ENCOUNTER — Encounter (HOSPITAL_COMMUNITY): Payer: Self-pay | Admitting: Neurosurgery

## 2023-07-31 DIAGNOSIS — M48062 Spinal stenosis, lumbar region with neurogenic claudication: Secondary | ICD-10-CM | POA: Diagnosis not present

## 2023-07-31 MED ORDER — HYDROCODONE-ACETAMINOPHEN 5-325 MG PO TABS
1.0000 | ORAL_TABLET | ORAL | 0 refills | Status: AC | PRN
Start: 2023-07-31 — End: ?

## 2023-07-31 MED ORDER — SENNOSIDES-DOCUSATE SODIUM 8.6-50 MG PO TABS
1.0000 | ORAL_TABLET | Freq: Two times a day (BID) | ORAL | Status: DC
  Administered 2023-07-31: 1 via ORAL
  Filled 2023-07-31: qty 1

## 2023-07-31 MED ORDER — CYCLOBENZAPRINE HCL 10 MG PO TABS: 10.0000 mg | ORAL_TABLET | Freq: Three times a day (TID) | ORAL | 0 refills | Status: AC | PRN

## 2023-07-31 MED FILL — Thrombin For Soln 5000 Unit: CUTANEOUS | Qty: 2 | Status: AC

## 2023-07-31 NOTE — Evaluation (Signed)
 Occupational Therapy Evaluation Patient Details Name: Terrance Snyder MRN: [id number] DOB: January 04, 1956 Today's Date: 07/31/2023   History of Present Illness   Terrance Snyder is a 68 yo male who is s/p decompressive lumbar laminectomy L1-L2 3 with partial medial facetectomies and foraminotomies of the L1 and L2 and L3 as well as redo decompressive laminectomy at L3-4 with partial facetectomies. PMHx: GERD, gout, HLD, HTN, OA, sleep apnea, R THA, cervical sx     Clinical Impressions Terrance Snyder was evaluated s/p the above admission list. He needs intermittent assist and DME/AD at baseline. Upon evaluation the pt was limited by back pain, spinal precautions, decreased activity tolerance, BLE weakness with buckling and chronic RUE/elbow pain with decreased ROM. Overall he needed close CGA and cues for transfers and short distance mobility with RW and increased time with rest breaks for safety. Due to the deficits listed below the pt also needs up to minA for UB ADLs and mod A for LB ADLs. Pt verbalized that his family will be able to assist with ADL tasks upon discharge. Pt will benefit from continued acute OT services and discharge home with support of family.      If plan is discharge home, recommend the following:   A little help with walking and/or transfers;A lot of help with bathing/dressing/bathroom;Assistance with cooking/housework;Assistance with feeding;Assist for transportation;Help with stairs or ramp for entrance     Functional Status Assessment   Patient has had a recent decline in their functional status and demonstrates the ability to make significant improvements in function in a reasonable and predictable amount of time.     Equipment Recommendations   None recommended by OT      Precautions/Restrictions   Precautions Precautions: Fall;Back Recall of Precautions/Restrictions: Intact Precaution/Restrictions Comments: Pt able to recall 3/3 back precautions from prior  back surgery Restrictions Weight Bearing Restrictions Per Provider Order: No     Mobility Bed Mobility Overal bed mobility: Needs Assistance Bed Mobility: Supine to Sit           General bed mobility comments: pt sitting upright upon arrival, he verablized understanding of log roll but states he may sleep in recliner at discharge    Transfers Overall transfer level: Needs assistance Equipment used: Rolling walker (2 wheels) Transfers: Sit to/from Stand Sit to Stand: Contact guard assist           General transfer comment: pt maintains bilat knee flexion in standing unless cued otherwise, BLE buckling noted      Balance Overall balance assessment: Needs assistance Sitting-balance support: Feet supported Sitting balance-Leahy Scale: Good     Standing balance support: Single extremity supported, During functional activity Standing balance-Leahy Scale: Fair Standing balance comment: close CGA and cues needed                           ADL either performed or assessed with clinical judgement   ADL Overall ADL's : Needs assistance/impaired Eating/Feeding: Independent   Grooming: Contact guard assist   Upper Body Bathing: Set up   Lower Body Bathing: Moderate assistance;Sit to/from stand   Upper Body Dressing : Minimal assistance;Sitting   Lower Body Dressing: Moderate assistance   Toilet Transfer: Contact guard assist;Rolling walker (2 wheels)   Toileting- Clothing Manipulation and Hygiene: Contact guard assist;Sit to/from stand       Functional mobility during ADLs: Contact guard assist General ADL Comments: assist due to BLE weakness with buckling, pain and limited activity tolerance  Vision Baseline Vision/History: 1 Wears glasses Vision Assessment?: No apparent visual deficits     Perception Perception: Within Functional Limits       Praxis Praxis: WFL       Pertinent Vitals/Pain Pain Assessment Pain Assessment: Faces Faces  Pain Scale: Hurts even more Pain Location: back Pain Descriptors / Indicators: Grimacing, Guarding, Discomfort Pain Intervention(s): Limited activity within patient's tolerance, Monitored during session     Extremity/Trunk Assessment Upper Extremity Assessment Upper Extremity Assessment: RUE deficits/detail RUE Deficits / Details: chronic bursitis of R elbow that effects ROM RUE Sensation: WNL RUE Coordination: decreased gross motor   Lower Extremity Assessment Lower Extremity Assessment: Defer to PT evaluation   Cervical / Trunk Assessment Cervical / Trunk Assessment: Back Surgery   Communication Communication Communication: No apparent difficulties   Cognition Arousal: Alert Behavior During Therapy: WFL for tasks assessed/performed Cognition: No apparent impairments           Following commands: Intact       Cueing  General Comments      VSS    Home Living Family/patient expects to be discharged to:: Private residence Living Arrangements: Spouse/significant other Available Help at Discharge: Family Type of Home: House Home Access: Stairs to enter [employer] of Steps: 2 in garage but has ramp there right now   Home Layout: Able to live on main level with bedroom/bathroom     Bathroom Shower/Tub: Producer, television/film/video: Handicapped height Bathroom Accessibility: Yes   Home Equipment: Agricultural consultant (2 wheels);Cane - single point;Shower seat;Wheelchair - IT trainer          Prior Functioning/Environment Prior Level of Function : Needs assist             Mobility Comments: intermittent use of RW ADLs Comments: Wife assists as needed, pt reports he was able to complete most ADLs mod I    OT Problem List: Decreased strength;Decreased range of motion;Decreased activity tolerance;Impaired balance (sitting and/or standing);Decreased knowledge of use of DME or AE;Decreased knowledge of precautions   OT  Treatment/Interventions: Self-care/ADL training;Therapeutic exercise;DME and/or AE instruction;Therapeutic activities;Patient/family education      OT Goals(Current goals can be found in the care plan section)   Acute Rehab OT Goals Patient Stated Goal: home asap OT Goal Formulation: With patient Time For Goal Achievement: 08/14/23 Potential to Achieve Goals: Good ADL Goals Pt Will Perform Upper Body Dressing: with modified independence Pt Will Perform Lower Body Dressing: with modified independence Pt Will Transfer to Toilet: with modified independence   OT Frequency:  Min 2X/week       AM-PAC OT "6 Clicks" Daily Activity     Outcome Measure Help from another person eating meals?: None Help from another person taking care of personal grooming?: A Little Help from another person toileting, which includes using toliet, bedpan, or urinal?: A Little Help from another person bathing (including washing, rinsing, drying)?: A Lot Help from another person to put on and taking off regular upper body clothing?: A Little Help from another person to put on and taking off regular lower body clothing?: A Lot 6 Click Score: 17   End of Session Equipment Utilized During Treatment: Rolling walker (2 wheels) Nurse Communication: Mobility status  Activity Tolerance: Patient limited by pain Patient left: in chair;with call bell/phone within reach  OT Visit Diagnosis: Unsteadiness on feet (R26.81);Other abnormalities of gait and mobility (R26.89);Muscle weakness (generalized) (M62.81)  Time: 4098-1191 OT Time Calculation (min): 21 min Charges:  OT General Charges $OT Visit: 1 Visit OT Evaluation $OT Eval Moderate Complexity: 1 Mod  Veryl Gottron, OTR/L Acute Rehabilitation Services Office [phone] Secure Chat Communication Preferred   Starla Easterly 07/31/2023, 9:03 AM

## 2023-07-31 NOTE — Evaluation (Signed)
 Physical Therapy Evaluation  Patient Details Name: Terrance Snyder MRN: [id number] DOB: 1955-09-26 Today's Date: 07/31/2023  History of Present Illness  Pt is a 68 y/o male who presents s/p decompressive lumbar laminectomy L1-L3 with partial medial facetectomies and foraminotomies of L1-L3 as well as redo decompressive laminectomy at L3-4 with partial facetectomies on 07/30/2023. PMH significant for gout, HTN, OA, sleep apnea, R THA, cervical sx   Clinical Impression  Pt admitted with above diagnosis. At the time of PT eval, pt was able to demonstrate transfers and ambulation with gross CGA to min assist and RW for support. Pt was educated on precautions, positioning recommendations, appropriate activity progression, and car transfer. Pt currently with functional limitations due to the deficits listed below (see PT Problem List). Pt will benefit from skilled PT to increase their independence and safety with mobility to allow discharge to the venue listed below.          If plan is discharge home, recommend the following: A little help with walking and/or transfers;A little help with bathing/dressing/bathroom;Assistance with cooking/housework;Assist for transportation;Help with stairs or ramp for entrance   Can travel by private vehicle        Equipment Recommendations None recommended by PT  Recommendations for Other Services       Functional Status Assessment Patient has had a recent decline in their functional status and demonstrates the ability to make significant improvements in function in a reasonable and predictable amount of time.     Precautions / Restrictions Precautions Precautions: Fall;Back Precaution Booklet Issued: Yes (comment) Recall of Precautions/Restrictions: Intact Precaution/Restrictions Comments: Reviewed handout and pt was cued for precautions during functional mobility. Required Braces or Orthoses:  (No brace needed order) Restrictions Weight Bearing  Restrictions Per Provider Order: No      Mobility  Bed Mobility               General bed mobility comments: Pt was received sitting up in recliner at start of session    Transfers Overall transfer level: Needs assistance Equipment used: Rolling walker (2 wheels) Transfers: Sit to/from Stand Sit to Stand: Min assist           General transfer comment: Assist for power up to full stand after several attempts where pt declined assist and was unsuccessful in achieving full stand from a low chair.    Ambulation/Gait Ambulation/Gait assistance: Contact guard assist Gait Distance (Feet): 200 Feet Assistive device: Rolling walker (2 wheels) Gait Pattern/deviations: Step-through pattern, Decreased stride length, Knees buckling, Trunk flexed, Narrow base of support Gait velocity: Decreased Gait velocity interpretation: <1.31 ft/sec, indicative of household ambulator   General Gait Details: Pt with soft knees throughout gait training with instances of mild knee buckling. With walker, pt was able to recover without assistance. Pt refused having hands on guarding for most of gait training, however close guard provided for safety as pt at a high risk for falls due to knee buckling.  Stairs Stairs: Yes Stairs assistance: Contact guard assist Stair Management: Two rails, Step to pattern, Forwards Number of Stairs: 2 General stair comments: Note from OT eval pt has a ramp. Pt did not mention a ramp during PT session, and states he has been placing RW at top of the 2 stairs and using that for support as pt negotiates the 2 stairs to enter home. Practiced facing forwards today, and opted to step down backwards  Wheelchair Mobility     Tilt Bed    Modified Rankin (Stroke Patients Only)  Balance Overall balance assessment: Needs assistance Sitting-balance support: Feet supported Sitting balance-Leahy Scale: Good     Standing balance support: Single extremity supported,  During functional activity Standing balance-Leahy Scale: Fair Standing balance comment: close CGA and cues needed                             Pertinent Vitals/Pain Pain Assessment Pain Assessment: Faces Faces Pain Scale: Hurts even more Pain Location: back Pain Descriptors / Indicators: Grimacing, Guarding, Discomfort Pain Intervention(s): Limited activity within patient's tolerance, Monitored during session, Repositioned    Home Living Family/patient expects to be discharged to:: Private residence Living Arrangements: Spouse/significant other Available Help at Discharge: Family Type of Home: House Home Access: Stairs to enter Entrance Stairs-Rails: None Entrance Stairs-Number of Steps: 2 in garage but has ramp there right now   Home Layout: Able to live on main level with bedroom/bathroom Home Equipment: Agricultural consultant (2 wheels);Cane - single point;Shower seat;Wheelchair - IT trainer      Prior Function Prior Level of Function : Needs assist             Mobility Comments: intermittent use of RW, mostly using cane PTA ADLs Comments: Wife assists as needed, pt reports he was able to complete most ADLs mod I     Extremity/Trunk Assessment   Upper Extremity Assessment Upper Extremity Assessment: Right hand dominant RUE Deficits / Details: chronic bursitis of R elbow that effects ROM RUE Sensation: WNL RUE Coordination: decreased gross motor    Lower Extremity Assessment Lower Extremity Assessment: Generalized weakness (Noted bilateral kknee flexion in stance, and difficulty maintaining knee extension/quad engagement with coughing/hiccuping)    Cervical / Trunk Assessment Cervical / Trunk Assessment: Back Surgery  Communication   Communication Communication: No apparent difficulties    Cognition Arousal: Alert Behavior During Therapy: WFL for tasks assessed/performed   PT - Cognitive impairments: No apparent impairments                          Following commands: Intact       Cueing Cueing Techniques: Verbal cues, Gestural cues     General Comments General comments (skin integrity, edema, etc.): VSS    Exercises     Assessment/Plan    PT Assessment Patient needs continued PT services  PT Problem List Decreased strength;Decreased activity tolerance;Decreased balance;Decreased mobility;Decreased knowledge of use of DME;Decreased safety awareness;Decreased knowledge of precautions;Pain       PT Treatment Interventions DME instruction;Gait training;Stair training;Functional mobility training;Therapeutic activities;Therapeutic exercise;Balance training;Patient/family education    PT Goals (Current goals can be found in the Care Plan section)  Acute Rehab PT Goals Patient Stated Goal: Home today PT Goal Formulation: With patient Time For Goal Achievement: 08/07/23 Potential to Achieve Goals: Good    Frequency Min 5X/week     Co-evaluation               AM-PAC PT "6 Clicks" Mobility  Outcome Measure Help needed turning from your back to your side while in a flat bed without using bedrails?: A Little Help needed moving from lying on your back to sitting on the side of a flat bed without using bedrails?: A Little Help needed moving to and from a bed to a chair (including a wheelchair)?: A Little Help needed standing up from a chair using your arms (e.g., wheelchair or bedside chair)?: A Little Help needed to walk in hospital room?: A  Little Help needed climbing 3-5 steps with a railing? : A Little 6 Click Score: 18    End of Session Equipment Utilized During Treatment: Gait belt Activity Tolerance: Patient tolerated treatment well Patient left: in chair;with call bell/phone within reach Nurse Communication: Mobility status PT Visit Diagnosis: Unsteadiness on feet (R26.81);Pain Pain - part of body:  (back)    Time: 1010-1030 PT Time Calculation (min) (ACUTE ONLY): 20  min   Charges:   PT Evaluation $PT Eval Low Complexity: 1 Low   PT General Charges $$ ACUTE PT VISIT: 1 Visit         Simone Dubois, PT, DPT Acute Rehabilitation Services Secure Chat Preferred Office: [phone]   Venus Ginsberg 07/31/2023, 11:50 AM

## 2023-07-31 NOTE — Progress Notes (Signed)
 Patient alert and oriented, mae's well, voiding adequate amount of urine, swallowing without difficulty, has positive flatus. No c/o pain at time of discharge. Patient discharged home with family. Script and discharged instructions given to patient. Patient and family stated understanding of instructions given. Room was checked and accounted for all patient's belongings; discharge instructions concerning his medications, incision care, follow up appointment and when to call the doctor as needed were all discussed with patient by RN and he expressed understanding on the instructions given.

## 2023-07-31 NOTE — Discharge Summary (Signed)
 Physician Discharge Summary  Patient ID: Terrance Snyder MRN: [id number] DOB/AGE: 1955/11/12 68 y.o.  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 The patient tolerated the procedure well and was taken to the recovery room and then to the floor in stable condition. The hospital course was routine. There were no complications. The wound remained clean dry and intact. Pt had appropriate back soreness. No complaints of leg pain or new N/T/W. The patient remained afebrile with stable vital signs, and tolerated a regular diet. The patient continued to increase activities, and pain was well controlled with oral pain medications.   Consults: None  Significant Diagnostic Studies:  Results for orders placed or performed during the hospital encounter of 07/20/23  Basic metabolic panel per protocol   Collection Time: 07/20/23 11:20 AM  Result Value Ref Range   Sodium 137 135 - 145 mmol/L   Potassium 3.5 3.5 - 5.1 mmol/L   Chloride 104 98 - 111 mmol/L   CO2 28 22 - 32 mmol/L   Glucose, Bld 108 (H) 70 - 99 mg/dL   BUN 8 8 - 23 mg/dL   Creatinine, Ser 4.09 0.61 - 1.24 mg/dL   Calcium 81.1 8.9 - 91.4 mg/dL   GFR, Estimated >78 >29 mL/min   Anion gap 5 5 - 15  CBC per protocol   Collection Time: 07/20/23 11:20 AM  Result Value Ref Range   WBC 4.7 4.0 - 10.5 K/uL   RBC 4.58 4.22 - 5.81 MIL/uL   Hemoglobin 12.6 (L) 13.0 - 17.0 g/dL   HCT 56.2 13.0 - 86.5 %   MCV 85.4 80.0 - 100.0 fL   MCH 27.5 26.0 - 34.0 pg   MCHC 32.2 30.0 - 36.0 g/dL   RDW 78.4 69.6 - 29.5 %   Platelets 250 150 - 400 K/uL   nRBC 0.0 0.0 - 0.2 %  Surgical pcr screen   Collection Time: 07/20/23  12:31 PM   Specimen: Nasal Mucosa; Nasal Swab  Result Value Ref Range   MRSA, PCR NEGATIVE NEGATIVE   Staphylococcus aureus NEGATIVE NEGATIVE    DG Lumbar Spine 1 View Result Date: 07/30/2023 CLINICAL DATA:  Elective surgery, intraop imaging for localization. EXAM: LUMBAR SPINE - 1 VIEW COMPARISON:  Preoperative imaging demonstrating standard segmentation. FINDINGS: Single lateral spot view of the lumbar spine submitted from the operating room. Surgical instruments localize over the posterior aspect of the L2 posterior elements. IMPRESSION: Intraoperative spot view of the lumbar spine for localization. Electronically Signed   By: Chadwick Colonel M.D.   On: 07/30/2023 12:44    Antibiotics:  Anti-infectives (From admission, onward)    Start     Dose/Rate Route Frequency Ordered Stop   07/30/23 1800  ceFAZolin  (ANCEF ) IVPB 2g/100 mL premix        2 g 200 mL/hr over 30 Minutes Intravenous Every 8 hours 07/30/23 1422 08/01/23 1759   07/30/23 0745  ceFAZolin  (ANCEF ) IVPB 3g/150 mL premix        3 g 300 mL/hr over 30 Minutes Intravenous On call to O.R. 07/30/23 0740 07/30/23 0950       Discharge Exam: Blood pressure 116/76, pulse 72, temperature 98.5 F (36.9 C), temperature source Oral, resp. rate 18, height 6'  3" (1.905 m), weight 122.5 kg, SpO2 98%. Neurologic: Grossly normal Ambulating and voiding well incision cdi   Discharge Medications:   Allergies as of 07/31/2023       Reactions   Shellfish Allergy Nausea And Vomiting        Medication List     TAKE these medications    amLODipine  10 MG tablet Commonly known as: NORVASC  TAKE 1 TABLET(10 MG) BY MOUTH DAILY   cyclobenzaprine  10 MG tablet Commonly known as: FLEXERIL  Take 1 tablet (10 mg total) by mouth 3 (three) times daily as needed for muscle spasms.   ezetimibe  10 MG tablet Commonly known as: ZETIA  Take 1 tablet (10 mg total) by mouth daily.   Humira (2 Pen) 40 MG/0.4ML pen Generic drug: adalimumab Inject  40 mg into the skin every 14 (fourteen) days.   HYDROcodone-acetaminophen  5-325 MG tablet Commonly known as: NORCO/VICODIN Take 1 tablet by mouth every 4 (four) hours as needed for severe pain (pain score 7-10).   Micardis  HCT 80-25 MG tablet Generic drug: telmisartan -hydrochlorothiazide  Take 1 tablet by mouth daily.   Repatha  SureClick 140 MG/ML Soaj Generic drug: Evolocumab  Inject 140 mg into the skin every 14 (fourteen) days.   TURMERIC PO Take 1 tablet by mouth daily.        Disposition: home   Final Dx: lumbar laminectomy L1-L4  Discharge Instructions      Remove dressing in 72 hours   Complete by: As directed    Call MD for:   Complete by: As directed    Call MD for:  difficulty breathing, headache or visual disturbances   Complete by: As directed    Call MD for:  hives   Complete by: As directed    Call MD for:  persistant dizziness or light-headedness   Complete by: As directed    Call MD for:  persistant nausea and vomiting   Complete by: As directed    Call MD for:  redness, tenderness, or signs of infection (pain, swelling, redness, odor or green/yellow discharge around incision site)   Complete by: As directed    Call MD for:  severe uncontrolled pain   Complete by: As directed    Call MD for:  temperature >100.4   Complete by: As directed    Diet - low sodium heart healthy   Complete by: As directed    Driving Restrictions   Complete by: As directed    No driving for 2 weeks, no riding in the car for 1 week   Increase activity slowly   Complete by: As directed    Lifting restrictions   Complete by: As directed    No lifting more than 8 lbs          Signed: Kenard Paul Rhylan Kagel 07/31/2023, 7:44 AM
